# Patient Record
Sex: Female | Born: 1960 | Race: White | Hispanic: No | Marital: Single | Smoking: Never smoker
Health system: Southern US, Community
[De-identification: ages and names within clinical notes are randomized; demographics above are authoritative.]

## PROBLEM LIST (undated history)

## (undated) DIAGNOSIS — E079 Disorder of thyroid, unspecified: Secondary | ICD-10-CM

## (undated) DIAGNOSIS — E785 Hyperlipidemia, unspecified: Secondary | ICD-10-CM

## (undated) DIAGNOSIS — F329 Major depressive disorder, single episode, unspecified: Secondary | ICD-10-CM

## (undated) DIAGNOSIS — M1990 Unspecified osteoarthritis, unspecified site: Secondary | ICD-10-CM

## (undated) DIAGNOSIS — F32A Depression, unspecified: Secondary | ICD-10-CM

## (undated) DIAGNOSIS — F419 Anxiety disorder, unspecified: Secondary | ICD-10-CM

## (undated) HISTORY — PX: DILATION AND CURETTAGE OF UTERUS: SHX78

## (undated) HISTORY — PX: BRONCHOSCOPY: SUR163

## (undated) HISTORY — DX: Disorder of thyroid, unspecified: E07.9

## (undated) HISTORY — DX: Depression, unspecified: F32.A

## (undated) HISTORY — PX: LAPAROSCOPIC SALPINGOOPHERECTOMY: SUR795

## (undated) HISTORY — PX: MYOMECTOMY: SHX85

## (undated) HISTORY — DX: Anxiety disorder, unspecified: F41.9

## (undated) HISTORY — DX: Unspecified osteoarthritis, unspecified site: M19.90

## (undated) HISTORY — DX: Major depressive disorder, single episode, unspecified: F32.9

## (undated) HISTORY — DX: Hyperlipidemia, unspecified: E78.5

## (undated) HISTORY — PX: TONSILLECTOMY: SUR1361

## (undated) HISTORY — PX: COLONOSCOPY: SHX174

---

## 1999-08-17 ENCOUNTER — Other Ambulatory Visit: Admission: RE | Admit: 1999-08-17 | Discharge: 1999-08-17 | Payer: Self-pay | Admitting: Obstetrics and Gynecology

## 2000-08-29 ENCOUNTER — Other Ambulatory Visit: Admission: RE | Admit: 2000-08-29 | Discharge: 2000-08-29 | Payer: Self-pay | Admitting: Obstetrics and Gynecology

## 2000-11-25 ENCOUNTER — Other Ambulatory Visit: Admission: RE | Admit: 2000-11-25 | Discharge: 2000-11-25 | Payer: Self-pay | Admitting: Obstetrics and Gynecology

## 2001-02-21 ENCOUNTER — Other Ambulatory Visit: Admission: RE | Admit: 2001-02-21 | Discharge: 2001-02-21 | Payer: Self-pay | Admitting: Obstetrics and Gynecology

## 2001-06-01 ENCOUNTER — Other Ambulatory Visit: Admission: RE | Admit: 2001-06-01 | Discharge: 2001-06-01 | Payer: Self-pay | Admitting: Obstetrics and Gynecology

## 2001-09-01 ENCOUNTER — Other Ambulatory Visit: Admission: RE | Admit: 2001-09-01 | Discharge: 2001-09-01 | Payer: Self-pay | Admitting: Obstetrics and Gynecology

## 2002-09-03 ENCOUNTER — Other Ambulatory Visit: Admission: RE | Admit: 2002-09-03 | Discharge: 2002-09-03 | Payer: Self-pay | Admitting: Obstetrics and Gynecology

## 2002-10-08 ENCOUNTER — Other Ambulatory Visit (HOSPITAL_COMMUNITY): Admission: RE | Admit: 2002-10-08 | Discharge: 2002-10-19 | Payer: Self-pay | Admitting: *Deleted

## 2003-09-05 ENCOUNTER — Other Ambulatory Visit: Admission: RE | Admit: 2003-09-05 | Discharge: 2003-09-05 | Payer: Self-pay | Admitting: Obstetrics and Gynecology

## 2004-09-02 ENCOUNTER — Ambulatory Visit: Payer: Self-pay | Admitting: Internal Medicine

## 2004-09-07 ENCOUNTER — Ambulatory Visit: Payer: Self-pay | Admitting: Internal Medicine

## 2004-09-15 ENCOUNTER — Ambulatory Visit: Payer: Self-pay

## 2004-12-15 ENCOUNTER — Other Ambulatory Visit: Admission: RE | Admit: 2004-12-15 | Discharge: 2004-12-15 | Payer: Self-pay | Admitting: Obstetrics and Gynecology

## 2005-02-01 ENCOUNTER — Ambulatory Visit: Payer: Self-pay | Admitting: Gastroenterology

## 2005-02-15 ENCOUNTER — Ambulatory Visit: Payer: Self-pay | Admitting: Gastroenterology

## 2006-02-09 ENCOUNTER — Ambulatory Visit: Payer: Self-pay | Admitting: Internal Medicine

## 2006-03-31 ENCOUNTER — Ambulatory Visit: Payer: Self-pay | Admitting: Internal Medicine

## 2006-04-12 ENCOUNTER — Ambulatory Visit: Payer: Self-pay | Admitting: Internal Medicine

## 2006-07-04 ENCOUNTER — Ambulatory Visit: Payer: Self-pay | Admitting: Internal Medicine

## 2006-07-13 ENCOUNTER — Ambulatory Visit: Payer: Self-pay | Admitting: Internal Medicine

## 2006-12-27 ENCOUNTER — Ambulatory Visit: Payer: Self-pay | Admitting: Internal Medicine

## 2006-12-27 LAB — CONVERTED CEMR LAB: Free T4: 0.7 ng/dL (ref 0.6–1.6)

## 2007-11-03 ENCOUNTER — Encounter: Admission: RE | Admit: 2007-11-03 | Discharge: 2007-11-03 | Payer: Self-pay | Admitting: Obstetrics and Gynecology

## 2008-02-01 ENCOUNTER — Telehealth: Payer: Self-pay | Admitting: Internal Medicine

## 2008-02-05 ENCOUNTER — Ambulatory Visit: Payer: Self-pay | Admitting: Internal Medicine

## 2008-02-05 LAB — CONVERTED CEMR LAB
BUN: 12 mg/dL (ref 6–23)
CO2: 29 meq/L (ref 19–32)
Chloride: 107 meq/L (ref 96–112)
GFR calc non Af Amer: 72 mL/min
Potassium: 4.1 meq/L (ref 3.5–5.1)

## 2008-02-08 ENCOUNTER — Ambulatory Visit: Payer: Self-pay | Admitting: Internal Medicine

## 2008-02-08 DIAGNOSIS — E039 Hypothyroidism, unspecified: Secondary | ICD-10-CM | POA: Insufficient documentation

## 2008-02-08 DIAGNOSIS — F32A Depression, unspecified: Secondary | ICD-10-CM | POA: Insufficient documentation

## 2008-02-08 DIAGNOSIS — F329 Major depressive disorder, single episode, unspecified: Secondary | ICD-10-CM

## 2008-05-16 ENCOUNTER — Encounter: Admission: RE | Admit: 2008-05-16 | Discharge: 2008-05-16 | Payer: Self-pay | Admitting: Obstetrics and Gynecology

## 2008-12-19 ENCOUNTER — Encounter: Admission: RE | Admit: 2008-12-19 | Discharge: 2008-12-19 | Payer: Self-pay | Admitting: Obstetrics and Gynecology

## 2009-01-10 ENCOUNTER — Telehealth (INDEPENDENT_AMBULATORY_CARE_PROVIDER_SITE_OTHER): Payer: Self-pay | Admitting: *Deleted

## 2009-01-22 ENCOUNTER — Ambulatory Visit: Payer: Self-pay | Admitting: Internal Medicine

## 2009-01-22 LAB — CONVERTED CEMR LAB
Basophils Relative: 0.2 % (ref 0.0–3.0)
CO2: 29 meq/L (ref 19–32)
Calcium: 8.7 mg/dL (ref 8.4–10.5)
Direct LDL: 143.5 mg/dL
GFR calc non Af Amer: 71.06 mL/min (ref >60)
HDL: 54.2 mg/dL (ref >39.00)
Hemoglobin, Urine: NEGATIVE
Hemoglobin: 13.3 g/dL (ref 12.0–15.0)
Lymphocytes Relative: 29.6 % (ref 12.0–46.0)
MCHC: 35 g/dL (ref 30.0–36.0)
Monocytes Relative: 7.8 % (ref 3.0–12.0)
Neutro Abs: 4.2 10*3/uL (ref 1.4–7.7)
Nitrite: NEGATIVE
RBC: 4.17 M/uL (ref 3.87–5.11)
Sodium: 142 meq/L (ref 135–145)
TSH: 1.29 microintl units/mL (ref 0.35–5.50)
Total CHOL/HDL Ratio: 4
Urobilinogen, UA: 0.2 (ref 0.0–1.0)
VLDL: 10 mg/dL (ref 0.0–40.0)

## 2009-01-30 ENCOUNTER — Ambulatory Visit: Payer: Self-pay | Admitting: Internal Medicine

## 2009-01-30 DIAGNOSIS — E785 Hyperlipidemia, unspecified: Secondary | ICD-10-CM | POA: Insufficient documentation

## 2010-01-02 ENCOUNTER — Encounter (INDEPENDENT_AMBULATORY_CARE_PROVIDER_SITE_OTHER): Payer: Self-pay | Admitting: *Deleted

## 2010-01-14 ENCOUNTER — Ambulatory Visit: Payer: Self-pay | Admitting: Internal Medicine

## 2010-01-14 LAB — CONVERTED CEMR LAB
AST: 16 units/L (ref 0–37)
Albumin: 3.8 g/dL (ref 3.5–5.2)
Alkaline Phosphatase: 48 units/L (ref 39–117)
BUN: 13 mg/dL (ref 6–23)
Basophils Absolute: 0 10*3/uL (ref 0.0–0.1)
Bilirubin, Direct: 0.1 mg/dL (ref 0.0–0.3)
Chloride: 107 meq/L (ref 96–112)
Cholesterol: 201 mg/dL — ABNORMAL HIGH (ref 0–200)
Creatinine, Ser: 0.9 mg/dL (ref 0.4–1.2)
Eosinophils Absolute: 0.1 10*3/uL (ref 0.0–0.7)
GFR calc non Af Amer: 70.77 mL/min (ref >60)
HCT: 38.9 % (ref 36.0–46.0)
Lymphs Abs: 2.2 10*3/uL (ref 0.7–4.0)
MCHC: 34.1 g/dL (ref 30.0–36.0)
Monocytes Absolute: 0.7 10*3/uL (ref 0.1–1.0)
Monocytes Relative: 8.4 % (ref 3.0–12.0)
Platelets: 308 10*3/uL (ref 150.0–400.0)
RDW: 13 % (ref 11.5–14.6)
TSH: 1.91 microintl units/mL (ref 0.35–5.50)
Total CHOL/HDL Ratio: 4

## 2010-01-20 ENCOUNTER — Ambulatory Visit: Payer: Self-pay | Admitting: Internal Medicine

## 2010-01-20 DIAGNOSIS — R5381 Other malaise: Secondary | ICD-10-CM | POA: Insufficient documentation

## 2010-01-20 DIAGNOSIS — R5383 Other fatigue: Secondary | ICD-10-CM

## 2010-01-20 DIAGNOSIS — L509 Urticaria, unspecified: Secondary | ICD-10-CM | POA: Insufficient documentation

## 2010-03-02 ENCOUNTER — Encounter (INDEPENDENT_AMBULATORY_CARE_PROVIDER_SITE_OTHER): Payer: Self-pay | Admitting: *Deleted

## 2010-03-04 ENCOUNTER — Ambulatory Visit: Payer: Self-pay | Admitting: Gastroenterology

## 2010-03-18 ENCOUNTER — Ambulatory Visit: Payer: Self-pay | Admitting: Gastroenterology

## 2010-03-20 IMAGING — MG MM DIGITAL DIAGNOSTIC UNILAT*L*
4 series · 4 of 4 positions shown · non-contrast
Comparison: Previous examinations at the [REDACTED] of
most recent are dated 11/03/2007 and 10/18/2007.

CLINICAL DATA: Follow-up probably benign left breast
calcifications.

DIGITAL DIAGNOSTIC LEFT MAMMOGRAM WITH CAD

[L CC (1 of 2)]
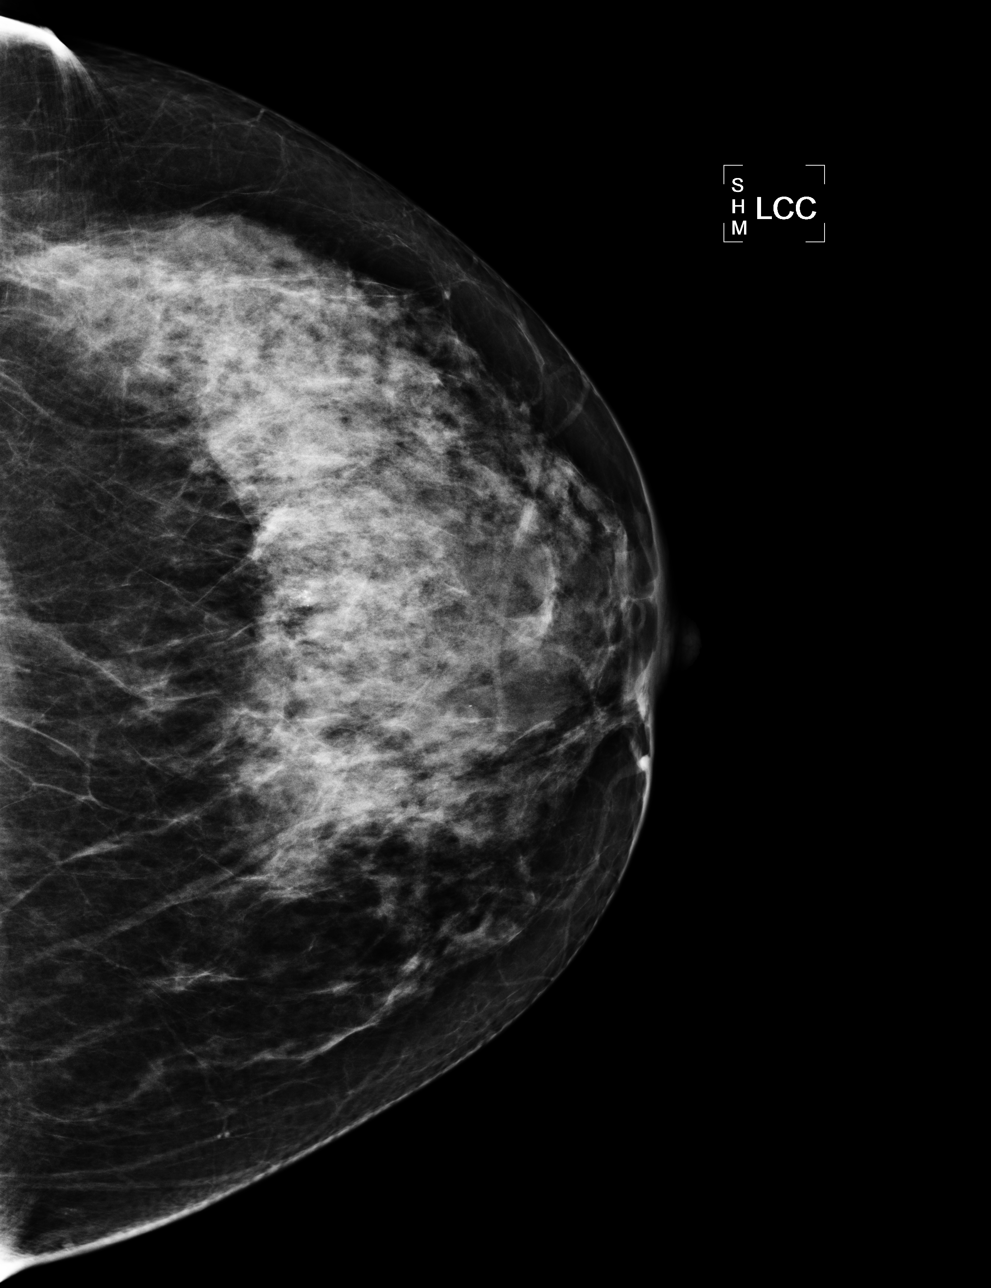

[L MLO]
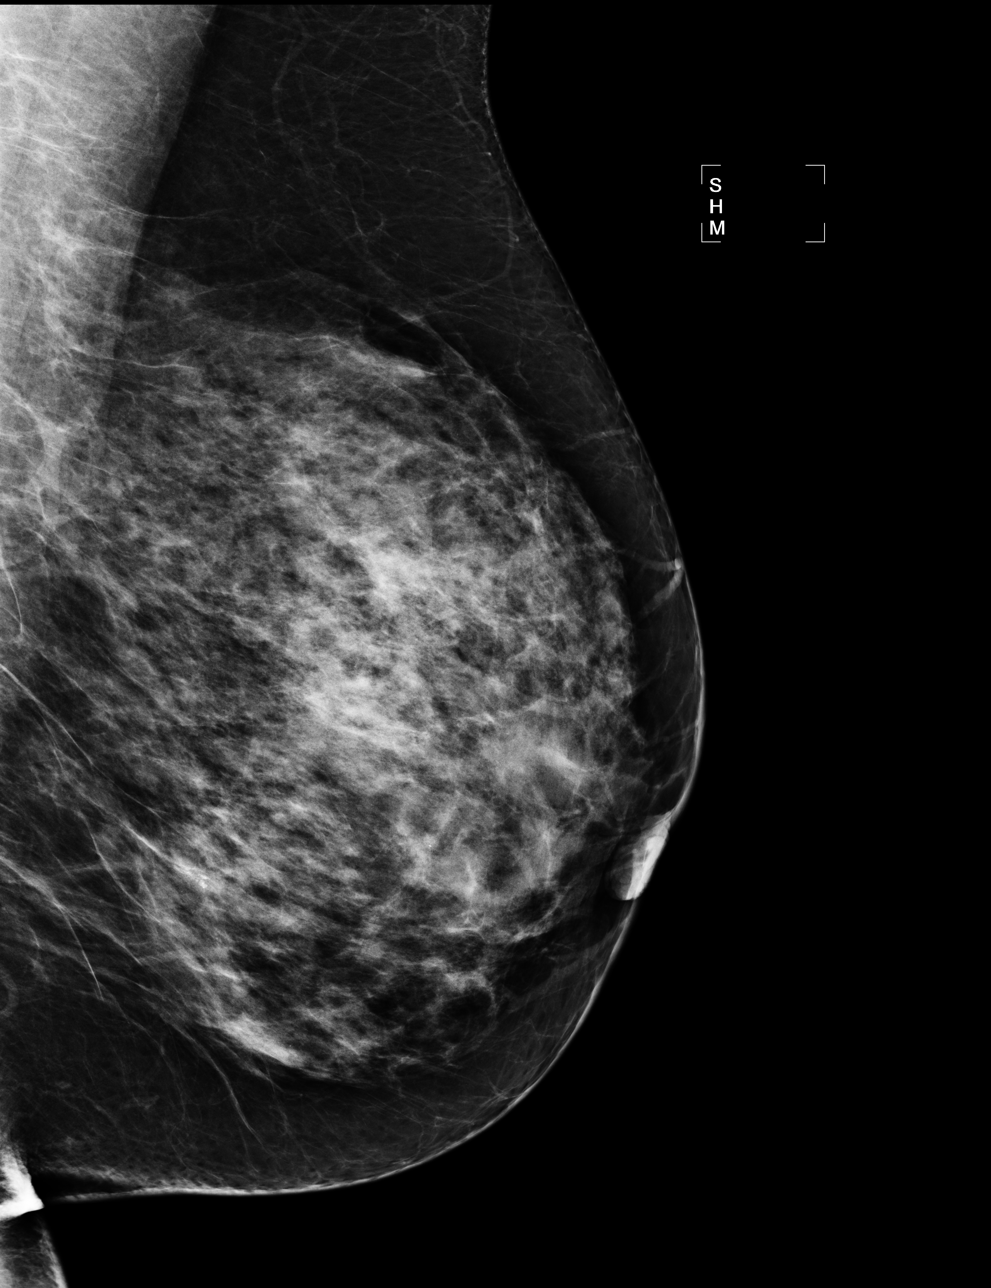

[L CC (2 of 2)]
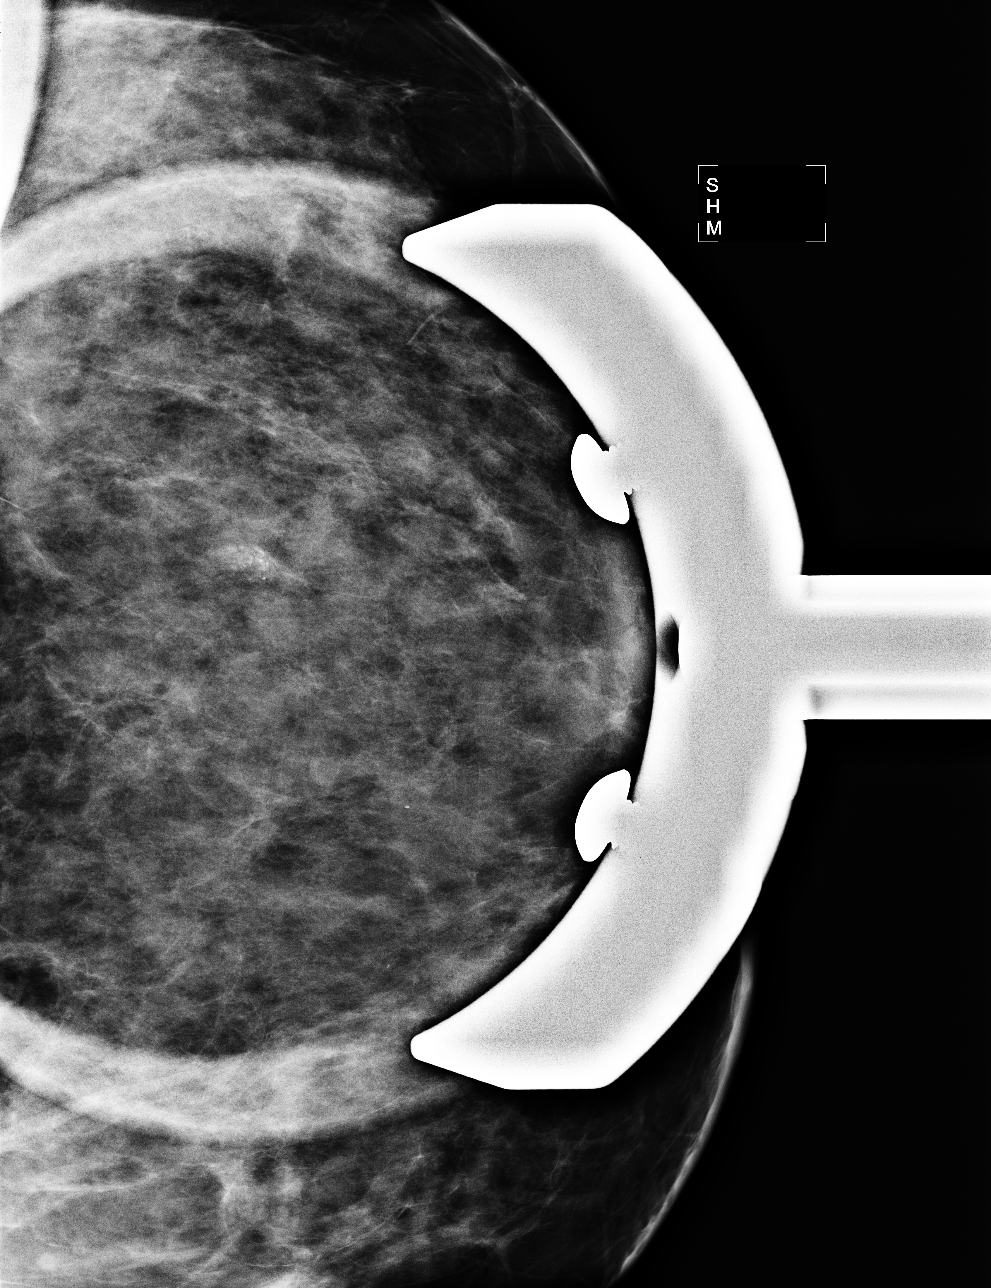

[L ML]
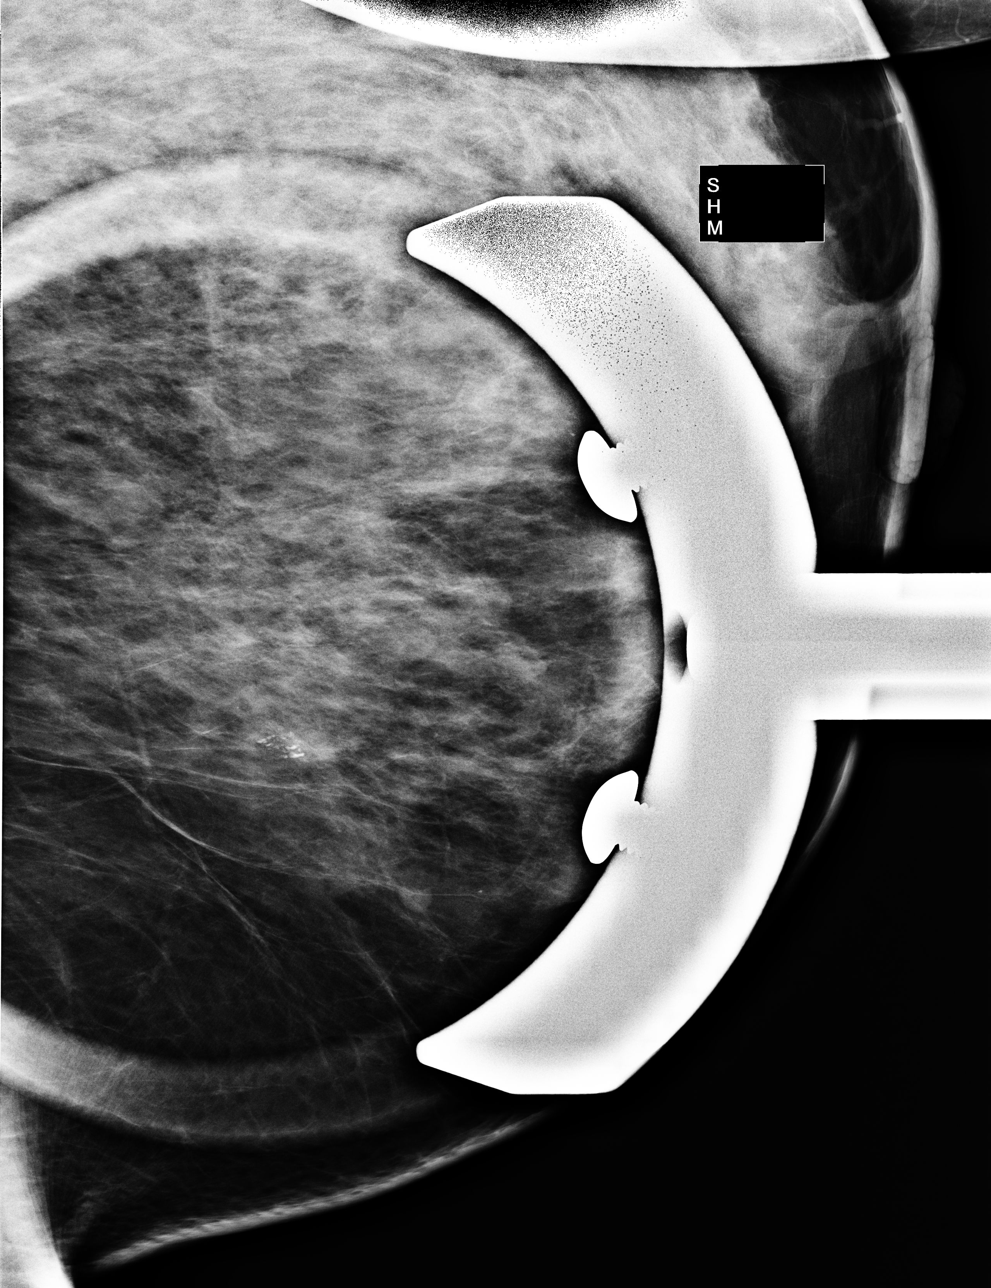

[4 of 4 positions shown; findings below may reference images not displayed]

FINDINGS: Stable heterogeneously dense parenchyma in the left
breast.  The previously evaluated probably benign left breast
calcifications demonstrate interval coalescence with dependent
layering, compatible with milk of calcium.  There are no findings
suspicious for malignancy.
IMPRESSION: Benign left breast calcifications.  No evidence of malignancy.
Annual screening mammography is recommended.  The patient will be
due for next screening mammogram in 5 months.  This has been
discussed with the patient.

BI-RADS CATEGORY 2:  Benign finding(s).

## 2010-04-24 ENCOUNTER — Ambulatory Visit: Payer: Self-pay | Admitting: Internal Medicine

## 2010-04-24 DIAGNOSIS — E559 Vitamin D deficiency, unspecified: Secondary | ICD-10-CM | POA: Insufficient documentation

## 2010-10-12 ENCOUNTER — Other Ambulatory Visit: Payer: Self-pay | Admitting: Internal Medicine

## 2010-10-12 ENCOUNTER — Ambulatory Visit
Admission: RE | Admit: 2010-10-12 | Discharge: 2010-10-12 | Payer: Self-pay | Source: Home / Self Care | Attending: Internal Medicine | Admitting: Internal Medicine

## 2010-10-12 ENCOUNTER — Encounter: Payer: Self-pay | Admitting: Internal Medicine

## 2010-10-12 LAB — CBC WITH DIFFERENTIAL/PLATELET
Basophils Absolute: 0 10*3/uL (ref 0.0–0.1)
Basophils Relative: 0.3 % (ref 0.0–3.0)
Eosinophils Absolute: 0.1 10*3/uL (ref 0.0–0.7)
Eosinophils Relative: 1.9 % (ref 0.0–5.0)
HCT: 41.7 % (ref 36.0–46.0)
Hemoglobin: 14.1 g/dL (ref 12.0–15.0)
Lymphocytes Relative: 35.9 % (ref 12.0–46.0)
Lymphs Abs: 2.2 10*3/uL (ref 0.7–4.0)
MCHC: 33.7 g/dL (ref 30.0–36.0)
MCV: 92.3 fl (ref 78.0–100.0)
Monocytes Absolute: 0.5 10*3/uL (ref 0.1–1.0)
Monocytes Relative: 8.2 % (ref 3.0–12.0)
Neutro Abs: 3.3 10*3/uL (ref 1.4–7.7)
Neutrophils Relative %: 53.7 % (ref 43.0–77.0)
Platelets: 328 10*3/uL (ref 150.0–400.0)
RBC: 4.52 Mil/uL (ref 3.87–5.11)
RDW: 14 % (ref 11.5–14.6)
WBC: 6.2 10*3/uL (ref 4.5–10.5)

## 2010-10-12 LAB — URINALYSIS, ROUTINE W REFLEX MICROSCOPIC
Bilirubin Urine: NEGATIVE
Ketones, ur: NEGATIVE
Leukocytes, UA: NEGATIVE
Nitrite: NEGATIVE
Specific Gravity, Urine: 1.015 (ref 1.000–1.030)
Urine Glucose: NEGATIVE
Urobilinogen, UA: 1 (ref 0.0–1.0)
pH: 8 (ref 5.0–8.0)

## 2010-10-12 LAB — LIPID PANEL
Cholesterol: 215 mg/dL — ABNORMAL HIGH (ref 0–200)
HDL: 52 mg/dL (ref >39.00)
Total CHOL/HDL Ratio: 4
Triglycerides: 67 mg/dL (ref 0.0–149.0)
VLDL: 13.4 mg/dL (ref 0.0–40.0)

## 2010-10-12 LAB — HEPATIC FUNCTION PANEL
ALT: 12 U/L (ref 0–35)
AST: 18 U/L (ref 0–37)
Albumin: 4.1 g/dL (ref 3.5–5.2)
Alkaline Phosphatase: 53 U/L (ref 39–117)
Bilirubin, Direct: 0.1 mg/dL (ref 0.0–0.3)
Total Bilirubin: 0.6 mg/dL (ref 0.3–1.2)
Total Protein: 6.7 g/dL (ref 6.0–8.3)

## 2010-10-12 LAB — LDL CHOLESTEROL, DIRECT: Direct LDL: 152 mg/dL

## 2010-10-12 LAB — TSH: TSH: 0.86 u[IU]/mL (ref 0.35–5.50)

## 2010-10-12 LAB — BASIC METABOLIC PANEL
BUN: 14 mg/dL (ref 6–23)
CO2: 28 mEq/L (ref 19–32)
Calcium: 8.9 mg/dL (ref 8.4–10.5)
Chloride: 101 mEq/L (ref 96–112)
Creatinine, Ser: 1 mg/dL (ref 0.4–1.2)
GFR: 66.29 mL/min (ref >60.00)
Glucose, Bld: 82 mg/dL (ref 70–99)
Potassium: 4.5 mEq/L (ref 3.5–5.1)
Sodium: 136 mEq/L (ref 135–145)

## 2010-10-28 ENCOUNTER — Encounter (INDEPENDENT_AMBULATORY_CARE_PROVIDER_SITE_OTHER): Payer: 59 | Admitting: Internal Medicine

## 2010-10-28 ENCOUNTER — Encounter: Payer: Self-pay | Admitting: Internal Medicine

## 2010-10-28 ENCOUNTER — Ambulatory Visit: Admit: 2010-10-28 | Payer: Self-pay | Admitting: Internal Medicine

## 2010-10-28 DIAGNOSIS — Z0000 Encounter for general adult medical examination without abnormal findings: Secondary | ICD-10-CM

## 2010-10-29 NOTE — Assessment & Plan Note (Signed)
Summary: 3 MO ROV /NWS   Vital Signs:  Patient profile:   50 year old female Height:      64 inches Weight:      210 pounds BMI:     36.18 O2 Sat:      99 % on Room air Temp:     98.7 degrees F oral Pulse rate:   95 / minute Pulse rhythm:   regular Resp:     16 per minute BP sitting:   120 / 80  (left arm) Cuff size:   large  Vitals Entered By: Lanier Prude, CMA(AAMA) (April 24, 2010 1:20 PM)  O2 Flow:  Room air CC: 3 mo f/u Is Patient Diabetic? No   CC:  3 mo f/u.  History of Present Illness: F/u hives, hypothyroidism, obesity  Current Medications (verified): 1)  Cymbalta 30 Mg  Cpep (Duloxetine Hcl) .... 3 Tablets By Mouth Once Daily 2)  Levoxyl 75 Mcg  Tabs (Levothyroxine Sodium) .... Take 1 Tablet By Mouth Once A Day 3)  Klonopin 1 Mg Tabs (Clonazepam) .... Once Daily 4)  Vitamin D3 1000 Unit  Tabs (Cholecalciferol) .Marland Kitchen.. 1 By Mouth Daily 5)  Calcium + D 600-200 Mg-Unit Tabs (Calcium Carbonate-Vitamin D) .Marland Kitchen.. 1 Tab Once Daily 6)  Loratadine 10 Mg Tabs (Loratadine) .Marland Kitchen.. 1 By Mouth Once Daily As Needed Allergies  Allergies (verified): No Known Drug Allergies  Past History:  Past Medical History: Last updated: 01/30/2009 Depression Hypothyroidism Hyperlipidemia, mild GYN Dr Henderson Cloud  Family History: Last updated: 01/20/2010 B lung ca 43  Social History: Last updated: 02/08/2008 Occupation: Single Never Smoked  Review of Systems       The patient complains of weight gain.  The patient denies chest pain and dyspnea on exertion.    Physical Exam  General:  overweight-appearing.   Eyes:  No corneal or conjunctival inflammation noted. EOMI. Perrla.  Nose:  External nasal examination shows no deformity or inflammation. Nasal mucosa are pink and moist without lesions or exudates. Mouth:  Oral mucosa and oropharynx without lesions or exudates.  Teeth in good repair. Lungs:  Normal respiratory effort, chest expands symmetrically. Lungs are clear to  auscultation, no crackles or wheezes. Heart:  Normal rate and regular rhythm. S1 and S2 normal without gallop, murmur, click, rub or other extra sounds. Abdomen:  Bowel sounds positive,abdomen soft and non-tender without masses, organomegaly or hernias noted. Msk:  No deformity or scoliosis noted of thoracic or lumbar spine.   Neurologic:  No cranial nerve deficits noted. Station and gait are normal. Plantar reflexes are down-going bilaterally. DTRs are symmetrical throughout. Sensory, motor and coordinative functions appear intact. Skin:  Intact without suspicious lesions or rashes Psych:  Oriented X3, normally interactive, and good eye contact.     Impression & Recommendations:  Problem # 1:  URTICARIA (ICD-708.9) resolved Assessment Improved  Problem # 2:  HYPOTHYROIDISM (ICD-244.9) Assessment: Unchanged  Her updated medication list for this problem includes:    Levoxyl 75 Mcg Tabs (Levothyroxine sodium) .Marland Kitchen... Take 1 tablet by mouth once a day  Problem # 3:  FATIGUE (ICD-780.79) Assessment: Unchanged  Problem # 4:  DEPRESSION (ICD-311) Assessment: Unchanged  Her updated medication list for this problem includes:    Cymbalta 30 Mg Cpep (Duloxetine hcl) .Marland KitchenMarland KitchenMarland KitchenMarland Kitchen 3 tablets by mouth once daily    Klonopin 1 Mg Tabs (Clonazepam) ..... Once daily  Problem # 5:  VITAMIN D DEFICIENCY (ICD-268.9) Assessment: Comment Only Restart Vit D  Complete Medication List: 1)  Cymbalta  30 Mg Cpep (Duloxetine hcl) .... 3 tablets by mouth once daily 2)  Levoxyl 75 Mcg Tabs (Levothyroxine sodium) .... Take 1 tablet by mouth once a day 3)  Klonopin 1 Mg Tabs (Clonazepam) .... Once daily 4)  Vitamin D3 1000 Unit Tabs (Cholecalciferol) .Marland Kitchen.. 1 by mouth daily 5)  Loratadine 10 Mg Tabs (Loratadine) .Marland Kitchen.. 1 by mouth once daily as needed allergies 6)  Fluorometholone 0.1 % Susp (Fluorometholone) .... As directed x 14 days 7)  Besivance 0.6 % Susp (Besifloxacin hcl) .... As directed x 7 days  Patient  Instructions: 1)  Please schedule a follow-up appointment in 6 months well w/labs and vit D 268.9. Prescriptions: LORATADINE 10 MG TABS (LORATADINE) 1 by mouth once daily as needed allergies  #30 x 11   Entered and Authorized by:   Tresa Garter MD   Signed by:   Tresa Garter MD on 04/24/2010   Method used:   [facility] to        [facility] [phone][facility] (retail)       [address]       [place], [place]  [address]       Ph: [phone] or [phone]       Fax: [phone]   RxID:   [id number]

## 2010-10-29 NOTE — Procedures (Signed)
Summary: Colonoscopy  Patient: [name] Note: All result statuses are Final unless otherwise noted.  Tests: (1) Colonoscopy (COL)   COL Colonoscopy           DONE     [facility]     [address]     [place], [place]  [address]           COLONOSCOPY PROCEDURE REPORT           PATIENT:  Barbara, Costa  MR#:  [id number]     BIRTHDATE:  08/02/1961, 49 yrs. old  GENDER:  female     ENDOSCOPIST:  Vania Rea. Jarold Motto, MD, [facility]     REF. BY:     PROCEDURE DATE:  03/18/2010     PROCEDURE:  Surveillance Colonoscopy     ASA CLASS:  Class II     INDICATIONS:  Elevated Risk Screening += FH colon CA in several     members     MEDICATIONS:   Fentanyl 100 mcg IV, Versed 10 mg IV           DESCRIPTION OF PROCEDURE:   After the risks benefits and     alternatives of the procedure were thoroughly explained, informed     consent was obtained.  Digital rectal exam was performed and     revealed no abnormalities.   The LB [id number] endoscope     was introduced through the anus and advanced to the cecum, which     was identified by both the appendix and ileocecal valve, limited     by poor preparation.    The quality of the prep was poor, using     MoviPrep.  The instrument was then slowly withdrawn as the colon     was fully examined.     <<PROCEDUREIMAGES>>           FINDINGS:  No polyps or cancers were seen.  Poor prep limited this     examination.   Retroflexed views in the rectum revealed no     abnormalities.    The scope was then withdrawn from the patient     and the procedure completed.           COMPLICATIONS:  None     ENDOSCOPIC IMPRESSION:     1) No polyps or cancers     2) Poor prep (exam limited)     limited exam per poor prep     RECOMMENDATIONS:     REPEAT EXAM WIYH "DOUBLE PREP" IN 3 YEARS.     REPEAT EXAM:  No           ______________________________     Vania Rea. Jarold Motto, MD, [facility]           CC:  Linda Hedges. Plotnikov, MD           n.  Rosalie DoctorVania Rea. Gianella Chismar at 03/18/2010 10:48 AM           Merlene Laughter, [id number]  Note: An exclamation mark (!) indicates a result that was not dispersed into the flowsheet. Document Creation Date: 03/18/2010 10:48 AM _______________________________________________________________________  (1) Order result status: Final Collection or observation date-time: 03/18/2010 10:41 Requested date-time:  Receipt date-time:  Reported date-time:  Referring Physician:   Ordering Physician: Barbara Costa [phone]) Specimen Source:  Source: Launa Grill Order Number: [id number] Lab site:   Appended Document: Colonoscopy     Procedures Next Due Date:    Colonoscopy: 03/2013

## 2010-10-29 NOTE — Letter (Signed)
Summary: Moviprep Instructions  [facility]  [address]   [place], [place] [address]   Phone: [phone]  Fax: [phone]       Barbara Costa    1961/06/16    MRN: [id number]        Procedure Day Dorna Bloom: Wednesday, 03-18-10     Arrival Time: 9:00 a.m.      Procedure Time: 10:00 a.m.     Location of Procedure:                    x   [facility] (4th Floor)                        PREPARATION FOR COLONOSCOPY WITH MOVIPREP   Starting 5 days prior to your procedure 03-13-10 do not eat nuts, seeds, popcorn, corn, beans, peas,  salads, or any raw vegetables.  Do not take any fiber supplements (e.g. Metamucil, Citrucel, and Benefiber).  THE DAY BEFORE YOUR PROCEDURE         DATE: 03-17-10   DAY: Tuesday  1.  Drink clear liquids the entire day-NO SOLID FOOD  2.  Do not drink anything colored red or purple.  Avoid juices with pulp.  No orange juice.  3.  Drink at least 64 oz. (8 glasses) of fluid/clear liquids during the day to prevent dehydration and help the prep work efficiently.  CLEAR LIQUIDS INCLUDE: Water Jello Ice Popsicles Tea (sugar ok, no milk/cream) Powdered fruit flavored drinks Coffee (sugar ok, no milk/cream) Gatorade Juice: apple, white grape, white cranberry  Lemonade Clear bullion, consomm, broth Carbonated beverages (any kind) Strained chicken noodle soup Hard Candy                             4.  In the morning, mix first dose of MoviPrep solution:    Empty 1 Pouch A and 1 Pouch B into the disposable container    Add lukewarm drinking water to the top line of the container. Mix to dissolve    Refrigerate (mixed solution should be used within 24 hrs)  5.  Begin drinking the prep at 5:00 p.m. The MoviPrep container is divided by 4 marks.   Every 15 minutes drink the solution down to the next mark (approximately 8 oz) until the full liter is complete.   6.  Follow completed prep with 16 oz of clear liquid of your  choice (Nothing red or purple).  Continue to drink clear liquids until bedtime.  7.  Before going to bed, mix second dose of MoviPrep solution:    Empty 1 Pouch A and 1 Pouch B into the disposable container    Add lukewarm drinking water to the top line of the container. Mix to dissolve    Refrigerate  THE DAY OF YOUR PROCEDURE      DATE: 03-18-10  DAY: Wednesday  Beginning at  5:00 a.m. (5 hours before procedure):         1. Every 15 minutes, drink the solution down to the next mark (approx 8 oz) until the full liter is complete.  2. Follow completed prep with 16 oz. of clear liquid of your choice.    3. You may drink clear liquids until 8:00 a.m.  (2 HOURS BEFORE PROCEDURE).   MEDICATION INSTRUCTIONS  Unless otherwise instructed, you should take regular prescription medications with a small sip of water   as early  as possible the morning of your procedure.           OTHER INSTRUCTIONS  You will need a responsible adult at least 50 years of age to accompany you and drive you home.   This person must remain in the waiting room during your procedure.  Wear loose fitting clothing that is easily removed.  Leave jewelry and other valuables at home.  However, you may wish to bring a book to read or  an iPod/MP3 player to listen to music as you wait for your procedure to start.  Remove all body piercing jewelry and leave at home.  Total time from sign-in until discharge is approximately 2-3 hours.  You should go home directly after your procedure and rest.  You can resume normal activities the  day after your procedure.  The day of your procedure you should not:   Drive   Make legal decisions   Operate machinery   Drink alcohol   Return to work  You will receive specific instructions about eating, activities and medications before you leave.    The above instructions have been reviewed and explained to me by   Ezra Sites RN  March 04, 2010 10:48 AM     I  fully understand and can verbalize these instructions _____________________________ Date _________

## 2010-10-29 NOTE — Miscellaneous (Signed)
Summary: [facility]  Clinical Lists Changes  Medications: Added new medication of MOVIPREP 100 GM  SOLR (PEG-KCL-NACL-NASULF-NA ASC-C) As per prep instructions. - Signed Rx of MOVIPREP 100 GM  SOLR (PEG-KCL-NACL-NASULF-NA ASC-C) As per prep instructions.;  #1 x 0;  Signed;  Entered by: Ezra Sites RN;  Authorized by: Mardella Layman MD [facility];  Method used: [facility] [phone]*, [address], [place], [place]  [address], Ph: [phone] or [phone], Fax: [phone] Observations: Added new observation of NKA: T (03/04/2010 10:26)    Prescriptions: MOVIPREP 100 GM  SOLR (PEG-KCL-NACL-NASULF-NA ASC-C) As per prep instructions.  #1 x 0   Entered by:   Ezra Sites RN   Authorized by:   Mardella Layman MD [facility]   Signed by:   Ezra Sites RN on 03/04/2010   Method used:   [facility]        [facility] [phone]* (retail)       [address]       [place], [place]  [address]       Ph: [phone] or [phone]       Fax: [phone]   RxID:   [id number]

## 2010-10-29 NOTE — Assessment & Plan Note (Signed)
Summary: 1 yr fu  $50---stc   Vital Signs:  Patient profile:   50 year old female Height:      64 inches (162.56 cm) Weight:      206.50 pounds (93.86 kg) BMI:     35.57 O2 Sat:      97 % on Room air Temp:     97.9 degrees F (36.61 degrees C) oral Pulse rate:   83 / minute Pulse rhythm:   regular BP sitting:   114 / 84  (left arm) Cuff size:   large  Vitals Entered By: Brenton Grills (January 20, 2010 9:46 AM)  O2 Flow:  Room air CC: 1 year f/u/pt states she needs 68 rx for Klonopin/aj   CC:  1 year f/u/pt states she needs 90 rx for Klonopin/aj.  History of Present Illness: C/o hives x 2 months q 2 wks C/o fatigue F/u  depression, hypothyroidism  Current Medications (verified): 1)  Cymbalta 30 Mg  Cpep (Duloxetine Hcl) .... 3 Tablets By Mouth Once Daily 2)  Levoxyl 75 Mcg  Tabs (Levothyroxine Sodium) .... Take 1 Tablet By Mouth Once A Day 3)  Klonopin 1 Mg Tabs (Clonazepam) .... Once Daily 4)  Vitamin D3 1000 Unit  Tabs (Cholecalciferol) .Marland Kitchen.. 1 By Mouth Daily 5)  Calcium + D 600-200 Mg-Unit Tabs (Calcium Carbonate-Vitamin D) .Marland Kitchen.. 1 Tab Once Daily  Allergies (verified): No Known Drug Allergies  Past History:  Past Medical History: Last updated: 01/30/2009 Depression Hypothyroidism Hyperlipidemia, mild GYN Dr Henderson Cloud  Social History: Last updated: 02/08/2008 Occupation: Single Never Smoked  Family History: B lung ca 43  Review of Systems  The patient denies fever, weight loss, weight gain, dyspnea on exertion, and prolonged cough.    Physical Exam  General:  overweight-appearing.   Mouth:  Oral mucosa and oropharynx without lesions or exudates.  Teeth in good repair. Lungs:  Normal respiratory effort, chest expands symmetrically. Lungs are clear to auscultation, no crackles or wheezes. Heart:  Normal rate and regular rhythm. S1 and S2 normal without gallop, murmur, click, rub or other extra sounds. Abdomen:  Bowel sounds positive,abdomen soft and  non-tender without masses, organomegaly or hernias noted. Msk:  No deformity or scoliosis noted of thoracic or lumbar spine.   Extremities:  No clubbing, cyanosis, edema, or deformity noted with normal full range of motion of all joints.   Neurologic:  No cranial nerve deficits noted. Station and gait are normal. Plantar reflexes are down-going bilaterally. DTRs are symmetrical throughout. Sensory, motor and coordinative functions appear intact. Psych:  Oriented X3, normally interactive, and good eye contact.     Impression & Recommendations:  Problem # 1:  FATIGUE (ICD-780.79) Assessment New Nuvigil offered The labs were reviewed with the patient.   Problem # 2:  URTICARIA (ICD-708.9) Assessment: New Loratidine 10 mg qd . See "Patient Instructions".   Problem # 3:  HYPOTHYROIDISM (ICD-244.9) Assessment: Unchanged  Her updated medication list for this problem includes:    Levoxyl 75 Mcg Tabs (Levothyroxine sodium) .Marland Kitchen... Take 1 tablet by mouth once a day  Problem # 4:  DEPRESSION (ICD-311) Assessment: Unchanged  Her updated medication list for this problem includes:    Cymbalta 30 Mg Cpep (Duloxetine hcl) .Marland KitchenMarland KitchenMarland KitchenMarland Kitchen 3 tablets by mouth once daily    Klonopin 1 Mg Tabs (Clonazepam) ..... Once daily  Complete Medication List: 1)  Cymbalta 30 Mg Cpep (Duloxetine hcl) .... 3 tablets by mouth once daily 2)  Levoxyl 75 Mcg Tabs (Levothyroxine sodium) .... Take 1  tablet by mouth once a day 3)  Klonopin 1 Mg Tabs (Clonazepam) .... Once daily 4)  Vitamin D3 1000 Unit Tabs (Cholecalciferol) .Marland Kitchen.. 1 by mouth daily 5)  Calcium + D 600-200 Mg-unit Tabs (Calcium carbonate-vitamin d) .Marland Kitchen.. 1 tab once daily 6)  Loratadine 10 Mg Tabs (Loratadine) .Marland Kitchen.. 1 by mouth once daily as needed allergies  Patient Instructions: 1)  Hold Vit D x 1-2 months  2)  Please schedule a follow-up appointment in 3 months. Prescriptions: LEVOXYL 75 MCG  TABS (LEVOTHYROXINE SODIUM) Take 1 tablet by mouth once a day  #90 x  3   Entered and Authorized by:   Tresa Garter MD   Signed by:   Tresa Garter MD on 01/20/2010   Method used:   Print then Give to Patient   RxID:   [id number] LORATADINE 10 MG TABS (LORATADINE) 1 by mouth once daily as needed allergies  #30 x 6   Entered and Authorized by:   Tresa Garter MD   Signed by:   Tresa Garter MD on 01/20/2010   Method used:   Print then Give to Patient   RxID:   [id number]

## 2010-10-29 NOTE — Letter (Signed)
Summary: Colonoscopy Letter  [facility]  [address] [place], [place] [address]   Phone: [phone]  Fax: [phone]      January 02, 2010 MRN: [id number]   Barbara Costa [address] [place], [place]  [address]   Dear Ms. Beery,   According to your medical record, it is time for you to schedule a Colonoscopy. The American Cancer Society recommends this procedure as a method to detect early colon cancer. Patients with a family history of colon cancer, or a personal history of colon polyps or inflammatory bowel disease are at increased risk.  This letter has beeen generated based on the recommendations made at the time of your procedure. If you feel that in your particular situation this may no longer apply, please contact our office.  Please call our office at [phone] to schedule this appointment or to update your records at your earliest convenience.  Thank you for cooperating with [place] to provide you with the very best care possible.   Sincerely,    Vania Rea. Jarold Motto, M.D.  [facility] [phone]

## 2010-11-04 NOTE — Assessment & Plan Note (Signed)
Summary: CPX NWS   Vital Signs:  Patient profile:   50 year old female Height:      64 inches Weight:      214 pounds BMI:     36.87 Temp:     98.7 degrees F oral Pulse rate:   80 / minute Pulse rhythm:   regular Resp:     16 per minute BP sitting:   110 / 80  (left arm) Cuff size:   large  Vitals Entered By: Lanier Prude, CMA(AAMA) (October 28, 2010 8:42 AM) CC: CPX Is Patient Diabetic? No Comments pt is not taking Loratadine, Fluorometholon or Besivance   CC:  CPX.  History of Present Illness: The patient presents for a preventive health examination   Preventive Screening-Counseling & Management  Alcohol-Tobacco     Smoking Status: never  Caffeine-Diet-Exercise     Does Patient Exercise: yes  Current Medications (verified): 1)  Cymbalta 30 Mg  Cpep (Duloxetine Hcl) .... 3 Tablets By Mouth Once Daily 2)  Levoxyl 75 Mcg  Tabs (Levothyroxine Sodium) .... Take 1 Tablet By Mouth Once A Day 3)  Klonopin 0.5 Mg Tabs (Clonazepam) .Marland Kitchen.. 1 By Mouth Once Daily 4)  Vitamin D3 1000 Unit  Tabs (Cholecalciferol) .Marland Kitchen.. 1 By Mouth Daily 5)  Loratadine 10 Mg Tabs (Loratadine) .Marland Kitchen.. 1 By Mouth Once Daily As Needed Allergies 6)  Fluorometholone 0.1 % Susp (Fluorometholone) .... As Directed X 14 Days 7)  Besivance 0.6 % Susp (Besifloxacin Hcl) .... As Directed X 7 Days 8)  Calcium 600+d Plus Minerals 600-400 Mg-Unit Chew (Calcium Carbonate-Vit D-Min) .Marland Kitchen.. 1 By Mouth Once Daily  Allergies (verified): No Known Drug Allergies  Past History:  Past Medical History: Last updated: 01/30/2009 Depression Hypothyroidism Hyperlipidemia, mild GYN Dr Henderson Cloud  Social History: Last updated: 10/28/2010 Occupation: Single Never Smoked Regular exercise-yes Alcohol use-yes occ  Family History: B lung ca 2 M CHF 35 F DM2  Social History: Occupation: Single Never Smoked Regular exercise-yes Alcohol use-yes occ Does Patient Exercise:  yes  Review of Systems       The patient  complains of weight gain.  The patient denies anorexia, fever, weight loss, vision loss, decreased hearing, hoarseness, chest pain, syncope, dyspnea on exertion, peripheral edema, prolonged cough, headaches, hemoptysis, abdominal pain, melena, hematochezia, severe indigestion/heartburn, hematuria, incontinence, genital sores, muscle weakness, suspicious skin lesions, transient blindness, difficulty walking, depression, unusual weight change, abnormal bleeding, enlarged lymph nodes, angioedema, and breast masses.    Physical Exam  General:  overweight-appearing.   Nose:  External nasal examination shows no deformity or inflammation. Nasal mucosa are pink and moist without lesions or exudates. Mouth:  Oral mucosa and oropharynx without lesions or exudates.  Teeth in good repair. Lungs:  Normal respiratory effort, chest expands symmetrically. Lungs are clear to auscultation, no crackles or wheezes. Heart:  Normal rate and regular rhythm. S1 and S2 normal without gallop, murmur, click, rub or other extra sounds. Abdomen:  Bowel sounds positive,abdomen soft and non-tender without masses, organomegaly or hernias noted. Msk:  No deformity or scoliosis noted of thoracic or lumbar spine.   Neurologic:  No cranial nerve deficits noted. Station and gait are normal. Plantar reflexes are down-going bilaterally. DTRs are symmetrical throughout. Sensory, motor and coordinative functions appear intact. Skin:  Intact without suspicious lesions or rashes Psych:  Oriented X3, normally interactive, and good eye contact.     Impression & Recommendations:  Problem # 1:  ROUTINE GENERAL MEDICAL EXAM@HEALTH  CARE FACL (ICD-V70.0) Assessment New She  had a mamo The labs were reviewed with the patient.  Health and age related issues were discussed. Available screening tests and vaccinations were discussed as well. Healthy life style including good diet and exercise was discussed.  EKG was ok  Problem # 2:   HYPOTHYROIDISM (ICD-244.9) Assessment: Unchanged  Her updated medication list for this problem includes:    Levoxyl 75 Mcg Tabs (Levothyroxine sodium) .Marland Kitchen... Take 1 tablet by mouth once a day  Problem # 3:  HYPERLIPIDEMIA (ICD-272.4) Assessment: Unchanged Diet and wt loss  Problem # 4:  DEPRESSION (ICD-311) Assessment: Unchanged  Her updated medication list for this problem includes:    Cymbalta 30 Mg Cpep (Duloxetine hcl) .Marland KitchenMarland KitchenMarland KitchenMarland Kitchen 3 tablets by mouth once daily    Klonopin 0.5 Mg Tabs (Clonazepam) .Marland Kitchen... 1 by mouth once daily  Complete Medication List: 1)  Cymbalta 30 Mg Cpep (Duloxetine hcl) .... 3 tablets by mouth once daily 2)  Levoxyl 75 Mcg Tabs (Levothyroxine sodium) .... Take 1 tablet by mouth once a day 3)  Klonopin 0.5 Mg Tabs (Clonazepam) .Marland Kitchen.. 1 by mouth once daily 4)  Vitamin D3 1000 Unit Tabs (Cholecalciferol) .Marland Kitchen.. 1 by mouth daily 5)  Loratadine 10 Mg Tabs (Loratadine) .Marland Kitchen.. 1 by mouth once daily as needed allergies 6)  Fluorometholone 0.1 % Susp (Fluorometholone) .... As directed x 14 days 7)  Besivance 0.6 % Susp (Besifloxacin hcl) .... As directed x 7 days 8)  Calcium 600+d Plus Minerals 600-400 Mg-unit Chew (Calcium carbonate-vit d-min) .Marland Kitchen.. 1 by mouth once daily  Other Orders: EKG w/ Interpretation (93000)  Patient Instructions: 1)  Please schedule a follow-up appointment in 1 year well w/labs. Prescriptions: LEVOXYL 75 MCG  TABS (LEVOTHYROXINE SODIUM) Take 1 tablet by mouth once a day  #90 x 3   Entered and Authorized by:   Tresa Garter MD   Signed by:   Tresa Garter MD on 10/28/2010   Method used:   Faxed to ...       [facility] (mail-order)             , [place]         Ph: [phone]       Fax: [phone]   RxID:   [id number]    Orders Added: 1)  EKG w/ Interpretation [93000] 2)  Est. Patient 40-64 years [62952]    Contraindications/Deferment of Procedures/Staging:    Test/Procedure: FLU VAX    Reason for deferment: patient  declined

## 2011-12-02 ENCOUNTER — Telehealth: Payer: Self-pay | Admitting: *Deleted

## 2011-12-02 DIAGNOSIS — Z0000 Encounter for general adult medical examination without abnormal findings: Secondary | ICD-10-CM

## 2011-12-02 NOTE — Telephone Encounter (Signed)
Message copied by Merrilyn Puma on Thu Dec 02, 2011  1:51 PM ------      Message from: Newell Coral      Created: Thu Dec 02, 2011 10:03 AM      Regarding: cpe sch, needs labs       The pt called and scheduled her cpe and is hoping for labs.  Thanks!!

## 2011-12-02 NOTE — Telephone Encounter (Signed)
Labs entered.

## 2011-12-04 ENCOUNTER — Other Ambulatory Visit: Payer: Self-pay | Admitting: Internal Medicine

## 2012-02-02 ENCOUNTER — Other Ambulatory Visit (INDEPENDENT_AMBULATORY_CARE_PROVIDER_SITE_OTHER): Payer: 59

## 2012-02-02 DIAGNOSIS — Z0000 Encounter for general adult medical examination without abnormal findings: Secondary | ICD-10-CM

## 2012-02-02 LAB — URINALYSIS, ROUTINE W REFLEX MICROSCOPIC
Bilirubin Urine: NEGATIVE
Urine Glucose: NEGATIVE
Urobilinogen, UA: 0.2 (ref 0.0–1.0)

## 2012-02-02 LAB — CBC WITH DIFFERENTIAL/PLATELET
Basophils Absolute: 0 10*3/uL (ref 0.0–0.1)
Eosinophils Absolute: 0.2 10*3/uL (ref 0.0–0.7)
HCT: 39 % (ref 36.0–46.0)
Lymphs Abs: 2.3 10*3/uL (ref 0.7–4.0)
MCHC: 33.5 g/dL (ref 30.0–36.0)
MCV: 92.1 fl (ref 78.0–100.0)
Monocytes Absolute: 0.7 10*3/uL (ref 0.1–1.0)
Neutro Abs: 4.4 10*3/uL (ref 1.4–7.7)
Platelets: 289 10*3/uL (ref 150.0–400.0)
RDW: 13.1 % (ref 11.5–14.6)

## 2012-02-02 LAB — BASIC METABOLIC PANEL
BUN: 13 mg/dL (ref 6–23)
CO2: 27 mEq/L (ref 19–32)
Chloride: 104 mEq/L (ref 96–112)
Creatinine, Ser: 0.8 mg/dL (ref 0.4–1.2)
Potassium: 3.9 mEq/L (ref 3.5–5.1)

## 2012-02-02 LAB — LIPID PANEL: Cholesterol: 215 mg/dL — ABNORMAL HIGH (ref 0–200)

## 2012-02-02 LAB — HEPATIC FUNCTION PANEL
ALT: 22 U/L (ref 0–35)
AST: 25 U/L (ref 0–37)
Total Protein: 6.6 g/dL (ref 6.0–8.3)

## 2012-02-02 LAB — TSH: TSH: 1.37 u[IU]/mL (ref 0.35–5.50)

## 2012-02-07 ENCOUNTER — Encounter: Payer: Self-pay | Admitting: Internal Medicine

## 2012-02-07 ENCOUNTER — Ambulatory Visit (INDEPENDENT_AMBULATORY_CARE_PROVIDER_SITE_OTHER): Payer: 59 | Admitting: Internal Medicine

## 2012-02-07 VITALS — BP 118/82 | HR 98 | Temp 97.0°F | Ht 64.0 in | Wt 217.0 lb

## 2012-02-07 DIAGNOSIS — F329 Major depressive disorder, single episode, unspecified: Secondary | ICD-10-CM

## 2012-02-07 DIAGNOSIS — E039 Hypothyroidism, unspecified: Secondary | ICD-10-CM

## 2012-02-07 DIAGNOSIS — F3289 Other specified depressive episodes: Secondary | ICD-10-CM

## 2012-02-07 DIAGNOSIS — Z23 Encounter for immunization: Secondary | ICD-10-CM

## 2012-02-07 DIAGNOSIS — Z0000 Encounter for general adult medical examination without abnormal findings: Secondary | ICD-10-CM | POA: Insufficient documentation

## 2012-02-07 DIAGNOSIS — E669 Obesity, unspecified: Secondary | ICD-10-CM | POA: Insufficient documentation

## 2012-02-07 MED ORDER — LEVOTHYROXINE SODIUM 75 MCG PO TABS: 75.0000 ug | ORAL_TABLET | Freq: Every day | ORAL | Status: DC

## 2012-02-07 NOTE — Progress Notes (Signed)
  Subjective:    Patient ID: Barbara Costa, female    DOB: [birth year], 51 y.o.   MRN: [id number]  HPI  The patient is here for a wellness exam. The patient has been doing well overall without major physical or psychological issues going on lately. The patient needs to address  Chronic hypothyroidism, depression Wt Readings from Last 3 Encounters:  02/07/12 217 lb (98.431 kg)  10/28/10 214 lb (97.07 kg)  04/24/10 210 lb (95.255 kg)   BP Readings from Last 3 Encounters:  02/07/12 118/82  10/28/10 110/80  04/24/10 120/80      Review of Systems  Constitutional: Negative for fever, chills, diaphoresis, activity change, appetite change, fatigue and unexpected weight change.  HENT: Negative for hearing loss, ear pain, congestion, sore throat, sneezing, mouth sores, neck pain, dental problem, voice change, postnasal drip and sinus pressure.   Eyes: Negative for pain and visual disturbance.  Respiratory: Negative for cough, chest tightness, wheezing and stridor.   Cardiovascular: Negative for chest pain, palpitations and leg swelling.  Gastrointestinal: Negative for nausea, vomiting, abdominal pain, blood in stool, abdominal distention and rectal pain.  Genitourinary: Negative for dysuria, hematuria, decreased urine volume, vaginal bleeding, vaginal discharge, difficulty urinating, vaginal pain and menstrual problem.  Musculoskeletal: Negative for back pain, joint swelling and gait problem.  Skin: Negative for color change, rash and wound.  Neurological: Negative for dizziness, tremors, syncope, speech difficulty and light-headedness.  Hematological: Negative for adenopathy.  Psychiatric/Behavioral: Negative for suicidal ideas, hallucinations, behavioral problems, confusion, sleep disturbance, dysphoric mood and decreased concentration. The patient is not hyperactive.        Objective:   Physical Exam  Constitutional: She appears well-developed. No distress.  HENT:  Head: Normocephalic.   Right Ear: External ear normal.  Left Ear: External ear normal.  Nose: Nose normal.  Mouth/Throat: Oropharynx is clear and moist.  Eyes: Conjunctivae are normal. Pupils are equal, round, and reactive to light. Right eye exhibits no discharge. Left eye exhibits no discharge.  Neck: Normal range of motion. Neck supple. No JVD present. No tracheal deviation present. No thyromegaly present.  Cardiovascular: Normal rate, regular rhythm and normal heart sounds.   Pulmonary/Chest: No stridor. No respiratory distress. She has no wheezes.  Abdominal: Soft. Bowel sounds are normal. She exhibits no distension and no mass. There is no tenderness. There is no rebound and no guarding.  Musculoskeletal: She exhibits no edema and no tenderness.  Lymphadenopathy:    She has no cervical adenopathy.  Neurological: She displays normal reflexes. No cranial nerve deficit. She exhibits normal muscle tone. Coordination normal.  Skin: No rash noted. No erythema.  Psychiatric: She has a normal mood and affect. Her behavior is normal. Judgment and thought content normal.    Lab Results  Component Value Date   WBC 7.6 02/02/2012   HGB 13.1 02/02/2012   HCT 39.0 02/02/2012   PLT 289.0 02/02/2012   GLUCOSE 79 02/02/2012   CHOL 215* 02/02/2012   TRIG 88.0 02/02/2012   HDL 59.90 02/02/2012   LDLDIRECT 149.4 02/02/2012   ALT 22 02/02/2012   AST 25 02/02/2012   NA 139 02/02/2012   K 3.9 02/02/2012   CL 104 02/02/2012   CREATININE 0.8 02/02/2012   BUN 13 02/02/2012   CO2 27 02/02/2012   TSH 1.37 02/02/2012         Assessment & Plan:

## 2012-02-07 NOTE — Patient Instructions (Signed)
Wt Readings from Last 3 Encounters:  02/07/12 217 lb (98.431 kg)  10/28/10 214 lb (97.07 kg)  04/24/10 210 lb (95.255 kg)

## 2012-02-07 NOTE — Assessment & Plan Note (Signed)
Continue with current prescription therapy as reflected on the Med list.  

## 2012-02-07 NOTE — Assessment & Plan Note (Addendum)
We discussed age appropriate health related issues, including available/recomended screening tests and vaccinations. We discussed a need for adhering to healthy diet and exercise. Labs/EKG were reviewed/ordered. All questions were answered. tdap Zostavax sggested

## 2012-02-07 NOTE — Assessment & Plan Note (Signed)
Chronic and refractory Wt loss discussed

## 2012-02-18 ENCOUNTER — Encounter: Payer: Self-pay | Admitting: Internal Medicine

## 2012-02-26 LAB — HM PAP SMEAR

## 2012-02-26 LAB — HM MAMMOGRAPHY

## 2012-05-21 ENCOUNTER — Other Ambulatory Visit: Payer: Self-pay | Admitting: Internal Medicine

## 2012-05-24 ENCOUNTER — Telehealth: Payer: Self-pay | Admitting: *Deleted

## 2012-05-24 MED ORDER — LEVOTHYROXINE SODIUM 75 MCG PO TABS: 75.0000 ug | ORAL_TABLET | Freq: Every day | ORAL | Status: DC

## 2012-05-24 NOTE — Telephone Encounter (Signed)
New Rx sent.  Pt informed  

## 2012-05-24 NOTE — Telephone Encounter (Signed)
Rec fax requesting to change Levoxyl 75 mcg to Levothroid if appropriate as Levoxyl is temp unavailable. Please advise.

## 2012-05-24 NOTE — Telephone Encounter (Signed)
Ok same dose Thx

## 2013-01-15 ENCOUNTER — Telehealth: Payer: Self-pay | Admitting: *Deleted

## 2013-01-15 DIAGNOSIS — Z0000 Encounter for general adult medical examination without abnormal findings: Secondary | ICD-10-CM

## 2013-01-15 NOTE — Telephone Encounter (Signed)
Message copied by Merrilyn Puma on Mon Jan 15, 2013 11:41 AM ------      Message from: Newell Coral      Created: Thu Jan 11, 2013 12:22 PM      Regarding: cpe sch       The pt scheduled a cpe for the end of May and is hoping to get labs done before hand  ------

## 2013-01-15 NOTE — Telephone Encounter (Signed)
CPE labs entered.  

## 2013-01-24 ENCOUNTER — Encounter: Payer: Self-pay | Admitting: Gastroenterology

## 2013-02-14 ENCOUNTER — Other Ambulatory Visit (INDEPENDENT_AMBULATORY_CARE_PROVIDER_SITE_OTHER): Payer: 59

## 2013-02-14 DIAGNOSIS — Z0000 Encounter for general adult medical examination without abnormal findings: Secondary | ICD-10-CM

## 2013-02-14 LAB — CBC WITH DIFFERENTIAL/PLATELET
Basophils Relative: 0.2 % (ref 0.0–3.0)
Eosinophils Relative: 2.3 % (ref 0.0–5.0)
Lymphocytes Relative: 30 % (ref 12.0–46.0)
MCV: 92.4 fl (ref 78.0–100.0)
Monocytes Relative: 7.6 % (ref 3.0–12.0)
Neutrophils Relative %: 59.9 % (ref 43.0–77.0)
Platelets: 267 10*3/uL (ref 150.0–400.0)
RBC: 4.3 Mil/uL (ref 3.87–5.11)
WBC: 7.3 10*3/uL (ref 4.5–10.5)

## 2013-02-14 LAB — LIPID PANEL
Cholesterol: 204 mg/dL — ABNORMAL HIGH (ref 0–200)
HDL: 58.5 mg/dL (ref >39.00)
Total CHOL/HDL Ratio: 3
Triglycerides: 86 mg/dL (ref 0.0–149.0)
VLDL: 17.2 mg/dL (ref 0.0–40.0)

## 2013-02-14 LAB — HEPATIC FUNCTION PANEL
ALT: 13 U/L (ref 0–35)
Alkaline Phosphatase: 44 U/L (ref 39–117)
Bilirubin, Direct: 0 mg/dL (ref 0.0–0.3)
Total Bilirubin: 0.7 mg/dL (ref 0.3–1.2)
Total Protein: 6.7 g/dL (ref 6.0–8.3)

## 2013-02-14 LAB — URINALYSIS, ROUTINE W REFLEX MICROSCOPIC
Hgb urine dipstick: NEGATIVE
Ketones, ur: NEGATIVE
Total Protein, Urine: NEGATIVE
Urine Glucose: NEGATIVE
WBC, UA: NONE SEEN (ref 0–?)

## 2013-02-14 LAB — BASIC METABOLIC PANEL
CO2: 27 mEq/L (ref 19–32)
Chloride: 102 mEq/L (ref 96–112)
Creatinine, Ser: 0.8 mg/dL (ref 0.4–1.2)
Potassium: 4.2 mEq/L (ref 3.5–5.1)
Sodium: 137 mEq/L (ref 135–145)

## 2013-02-14 LAB — TSH: TSH: 0.92 u[IU]/mL (ref 0.35–5.50)

## 2013-02-14 LAB — LDL CHOLESTEROL, DIRECT: Direct LDL: 123.6 mg/dL

## 2013-02-22 ENCOUNTER — Ambulatory Visit (INDEPENDENT_AMBULATORY_CARE_PROVIDER_SITE_OTHER): Payer: 59 | Admitting: Internal Medicine

## 2013-02-22 ENCOUNTER — Encounter: Payer: Self-pay | Admitting: Internal Medicine

## 2013-02-22 VITALS — BP 124/86 | HR 80 | Temp 97.9°F | Resp 16 | Ht 64.0 in | Wt 166.0 lb

## 2013-02-22 DIAGNOSIS — Z0000 Encounter for general adult medical examination without abnormal findings: Secondary | ICD-10-CM

## 2013-02-22 MED ORDER — LEVOTHYROXINE SODIUM 75 MCG PO TABS: 75.0000 ug | ORAL_TABLET | Freq: Every day | ORAL | Status: DC

## 2013-02-22 NOTE — Assessment & Plan Note (Signed)
We discussed age appropriate health related issues, including available/recomended screening tests and vaccinations. We discussed a need for adhering to healthy diet and exercise. Labs/EKG were reviewed/ordered. All questions were answered.   

## 2013-02-22 NOTE — Progress Notes (Signed)
Patient ID: Barbara Costa, female   DOB: [birth year], 52 y.o.   MRN: [id number]  Subjective:    Patient ID: Barbara Costa, female    DOB: 02/09/1961, 52 y.o.   MRN: [id number]  HPI  The patient is here for a wellness exam. The patient has been doing well overall without major physical or psychological issues going on lately. The patient needs to address  Chronic hypothyroidism, depression Wt Readings from Last 3 Encounters:  02/22/13 166 lb (75.297 kg)  02/07/12 217 lb (98.431 kg)  10/28/10 214 lb (97.07 kg)   BP Readings from Last 3 Encounters:  02/22/13 124/86  02/07/12 118/82  10/28/10 110/80      Review of Systems  Constitutional: Negative for fever, chills, diaphoresis, activity change, appetite change, fatigue and unexpected weight change.  HENT: Negative for hearing loss, ear pain, congestion, sore throat, sneezing, mouth sores, neck pain, dental problem, voice change, postnasal drip and sinus pressure.   Eyes: Negative for pain and visual disturbance.  Respiratory: Negative for cough, chest tightness, wheezing and stridor.   Cardiovascular: Negative for chest pain, palpitations and leg swelling.  Gastrointestinal: Negative for nausea, vomiting, abdominal pain, blood in stool, abdominal distention and rectal pain.  Genitourinary: Negative for dysuria, hematuria, decreased urine volume, vaginal bleeding, vaginal discharge, difficulty urinating, vaginal pain and menstrual problem.  Musculoskeletal: Negative for back pain, joint swelling and gait problem.  Skin: Negative for color change, rash and wound.  Neurological: Negative for dizziness, tremors, syncope, speech difficulty and light-headedness.  Hematological: Negative for adenopathy.  Psychiatric/Behavioral: Negative for suicidal ideas, hallucinations, behavioral problems, confusion, sleep disturbance, dysphoric mood and decreased concentration. The patient is not hyperactive.        Objective:   Physical Exam   Constitutional: She appears well-developed. No distress.  HENT:  Head: Normocephalic.  Right Ear: External ear normal.  Left Ear: External ear normal.  Nose: Nose normal.  Mouth/Throat: Oropharynx is clear and moist.  Eyes: Conjunctivae are normal. Pupils are equal, round, and reactive to light. Right eye exhibits no discharge. Left eye exhibits no discharge.  Neck: Normal range of motion. Neck supple. No JVD present. No tracheal deviation present. No thyromegaly present.  Cardiovascular: Normal rate, regular rhythm and normal heart sounds.   Pulmonary/Chest: No stridor. No respiratory distress. She has no wheezes.  Abdominal: Soft. Bowel sounds are normal. She exhibits no distension and no mass. There is no tenderness. There is no rebound and no guarding.  Musculoskeletal: She exhibits no edema and no tenderness.  Lymphadenopathy:    She has no cervical adenopathy.  Neurological: She displays normal reflexes. No cranial nerve deficit. She exhibits normal muscle tone. Coordination normal.  Skin: No rash noted. No erythema.  Psychiatric: She has a normal mood and affect. Her behavior is normal. Judgment and thought content normal.    Lab Results  Component Value Date   WBC 7.3 02/14/2013   HGB 13.5 02/14/2013   HCT 39.7 02/14/2013   PLT 267.0 02/14/2013   GLUCOSE 74 02/14/2013   CHOL 204* 02/14/2013   TRIG 86.0 02/14/2013   HDL 58.50 02/14/2013   LDLDIRECT 123.6 02/14/2013   ALT 13 02/14/2013   AST 15 02/14/2013   NA 137 02/14/2013   K 4.2 02/14/2013   CL 102 02/14/2013   CREATININE 0.8 02/14/2013   BUN 15 02/14/2013   CO2 27 02/14/2013   TSH 0.92 02/14/2013         Assessment & Plan:

## 2013-03-21 ENCOUNTER — Other Ambulatory Visit: Payer: Self-pay

## 2013-03-21 MED ORDER — LEVOTHYROXINE SODIUM 75 MCG PO TABS: 75.0000 ug | ORAL_TABLET | Freq: Every day | ORAL | Status: DC

## 2013-03-21 NOTE — Telephone Encounter (Signed)
Received letter from pt dated 03/16/13 requesting that Levothroid is sent to [facility]. Patient notified via [employer].

## 2013-06-15 ENCOUNTER — Other Ambulatory Visit: Payer: Self-pay | Admitting: Internal Medicine

## 2013-06-15 ENCOUNTER — Encounter: Payer: Self-pay | Admitting: Internal Medicine

## 2013-06-15 NOTE — Telephone Encounter (Signed)
Refill done.  

## 2013-06-18 ENCOUNTER — Other Ambulatory Visit: Payer: Self-pay | Admitting: *Deleted

## 2013-06-18 MED ORDER — LEVOTHYROXINE SODIUM 75 MCG PO TABS: 75.0000 ug | ORAL_TABLET | Freq: Every day | ORAL | Status: DC

## 2013-06-27 ENCOUNTER — Encounter: Payer: Self-pay | Admitting: Gastroenterology

## 2013-07-03 ENCOUNTER — Encounter: Payer: Self-pay | Admitting: Gastroenterology

## 2013-07-03 ENCOUNTER — Ambulatory Visit (AMBULATORY_SURGERY_CENTER): Payer: Self-pay

## 2013-07-03 VITALS — Ht 64.0 in | Wt 154.0 lb

## 2013-07-03 DIAGNOSIS — Z800 Family history of malignant neoplasm of digestive organs: Secondary | ICD-10-CM

## 2013-07-03 MED ORDER — MOVIPREP 100 G PO SOLR
ORAL | Status: DC
Start: 2013-07-03 — End: 2013-07-16

## 2013-07-03 MED ORDER — POLYETHYLENE GLYCOL 3350 17 GM/SCOOP PO POWD: 17.0000 g | Freq: Every day | ORAL | Status: DC

## 2013-07-16 ENCOUNTER — Encounter (HOSPITAL_COMMUNITY): Payer: Self-pay | Admitting: Emergency Medicine

## 2013-07-16 ENCOUNTER — Ambulatory Visit (INDEPENDENT_AMBULATORY_CARE_PROVIDER_SITE_OTHER): Payer: 59 | Admitting: Internal Medicine

## 2013-07-16 ENCOUNTER — Encounter: Payer: Self-pay | Admitting: Gastroenterology

## 2013-07-16 ENCOUNTER — Telehealth: Payer: Self-pay | Admitting: Internal Medicine

## 2013-07-16 ENCOUNTER — Ambulatory Visit (AMBULATORY_SURGERY_CENTER): Payer: 59 | Admitting: Gastroenterology

## 2013-07-16 ENCOUNTER — Encounter: Payer: Self-pay | Admitting: Internal Medicine

## 2013-07-16 ENCOUNTER — Emergency Department (HOSPITAL_COMMUNITY)
Admission: EM | Admit: 2013-07-16 | Discharge: 2013-07-16 | DRG: 310 | Disposition: A | Discharge status: Home / Self Care | Payer: 59 | Attending: Internal Medicine | Admitting: Internal Medicine

## 2013-07-16 ENCOUNTER — Emergency Department (HOSPITAL_COMMUNITY): Payer: 59

## 2013-07-16 VITALS — BP 112/69 | HR 137 | Temp 97.2°F | Resp 21 | Ht 64.0 in | Wt 154.0 lb

## 2013-07-16 DIAGNOSIS — F329 Major depressive disorder, single episode, unspecified: Secondary | ICD-10-CM | POA: Diagnosis present

## 2013-07-16 DIAGNOSIS — E039 Hypothyroidism, unspecified: Secondary | ICD-10-CM

## 2013-07-16 DIAGNOSIS — Z1211 Encounter for screening for malignant neoplasm of colon: Secondary | ICD-10-CM

## 2013-07-16 DIAGNOSIS — F3289 Other specified depressive episodes: Secondary | ICD-10-CM | POA: Diagnosis present

## 2013-07-16 DIAGNOSIS — I4891 Unspecified atrial fibrillation: Secondary | ICD-10-CM | POA: Diagnosis present

## 2013-07-16 DIAGNOSIS — E162 Hypoglycemia, unspecified: Secondary | ICD-10-CM

## 2013-07-16 DIAGNOSIS — F411 Generalized anxiety disorder: Secondary | ICD-10-CM | POA: Diagnosis present

## 2013-07-16 DIAGNOSIS — E860 Dehydration: Secondary | ICD-10-CM | POA: Insufficient documentation

## 2013-07-16 DIAGNOSIS — Z800 Family history of malignant neoplasm of digestive organs: Secondary | ICD-10-CM

## 2013-07-16 LAB — URINALYSIS, ROUTINE W REFLEX MICROSCOPIC
Bilirubin Urine: NEGATIVE
Glucose, UA: NEGATIVE mg/dL
Hgb urine dipstick: NEGATIVE
Ketones, ur: 40 mg/dL — AB
Nitrite: NEGATIVE
Protein, ur: NEGATIVE mg/dL
Urobilinogen, UA: 0.2 mg/dL (ref 0.0–1.0)

## 2013-07-16 LAB — POCT I-STAT TROPONIN I: Troponin i, poc: 0.02 ng/mL (ref 0.00–0.08)

## 2013-07-16 LAB — CBC WITH DIFFERENTIAL/PLATELET
Basophils Absolute: 0 10*3/uL (ref 0.0–0.1)
Basophils Relative: 0 % (ref 0–1)
Eosinophils Absolute: 0.1 10*3/uL (ref 0.0–0.7)
Eosinophils Relative: 1 % (ref 0–5)
Hemoglobin: 13.4 g/dL (ref 12.0–15.0)
Lymphocytes Relative: 20 % (ref 12–46)
Lymphs Abs: 2.1 10*3/uL (ref 0.7–4.0)
MCH: 30.5 pg (ref 26.0–34.0)
MCHC: 33.8 g/dL (ref 30.0–36.0)
MCV: 90.4 fL (ref 78.0–100.0)
Monocytes Relative: 5 % (ref 3–12)
Neutrophils Relative %: 74 % (ref 43–77)
RBC: 4.39 MIL/uL (ref 3.87–5.11)

## 2013-07-16 LAB — POCT I-STAT, CHEM 8
Chloride: 113 mEq/L — ABNORMAL HIGH (ref 96–112)
HCT: 41 % (ref 36.0–46.0)
Hemoglobin: 13.9 g/dL (ref 12.0–15.0)
Potassium: 3.1 mEq/L — ABNORMAL LOW (ref 3.5–5.1)
Sodium: 146 mEq/L — ABNORMAL HIGH (ref 135–145)

## 2013-07-16 LAB — URINE MICROSCOPIC-ADD ON

## 2013-07-16 LAB — GLUCOSE, CAPILLARY: Glucose-Capillary: 53 mg/dL — ABNORMAL LOW (ref 70–99)

## 2013-07-16 MED ORDER — SODIUM CHLORIDE 0.9 % IV BOLUS (SEPSIS)
1000.0000 mL | Freq: Once | INTRAVENOUS | Status: AC
  Administered 2013-07-16: 1000 mL via INTRAVENOUS

## 2013-07-16 MED ORDER — SODIUM CHLORIDE 0.9 % IV SOLN: INTRAVENOUS | Status: DC

## 2013-07-16 MED ORDER — DEXTROSE 50 % IV SOLN
1.0000 | Freq: Once | INTRAVENOUS | Status: AC
  Administered 2013-07-16: 50 mL via INTRAVENOUS

## 2013-07-16 MED ORDER — DEXTROSE 50 % IV SOLN
INTRAVENOUS | Status: AC
  Administered 2013-07-16: 50 mL via INTRAVENOUS
  Filled 2013-07-16: qty 50

## 2013-07-16 MED ORDER — POTASSIUM CHLORIDE CRYS ER 20 MEQ PO TBCR
20.0000 meq | EXTENDED_RELEASE_TABLET | Freq: Once | ORAL | Status: AC
  Administered 2013-07-16: 20 meq via ORAL
  Filled 2013-07-16: qty 1

## 2013-07-16 MED ORDER — SODIUM CHLORIDE 0.9 % IV SOLN: 500.0000 mL | INTRAVENOUS | Status: DC

## 2013-07-16 MED ORDER — DILTIAZEM HCL 100 MG IV SOLR
5.0000 mg/h | Freq: Once | INTRAVENOUS | Status: AC
  Administered 2013-07-16: 5 mg/h via INTRAVENOUS

## 2013-07-16 NOTE — Op Note (Signed)
 [facility] [address] [place], [address]   COLONOSCOPY PROCEDURE REPORT  PATIENT: Barbara Costa, Barbara Costa  MR#: [id number] BIRTHDATE: 1960/12/25 , 52  yrs. old GENDER: Female ENDOSCOPIST: Mardella Layman, MD, [facility] REFERRED BY: PROCEDURE DATE:  07/16/2013 PROCEDURE:   Colonoscopy, screening First Screening Colonoscopy - Avg.  risk and is 50 yrs.  old or older - No.      History of Adenoma - Now for follow-up colonoscopy & has been > or = to 3 yrs.  N/A ASA CLASS:   Class II INDICATIONS:Patient's family history of colon polyps. MEDICATIONS: propofol (Diprivan) 300mg  IV ,Labatelol 100mg .  DESCRIPTION OF PROCEDURE:   After the risks benefits and alternatives of the procedure were thoroughly explained, informed consent was obtained.  A digital rectal exam revealed no abnormalities of the rectum.   The LB [id number]  endoscope was introduced through the anus and advanced to the cecum, which was identified by both the appendix and ileocecal valve. No adverse events experienced.   Limited by a redundant colon.   The quality of the prep was excellent, using MoviPrep  The instrument was then slowly withdrawn as the colon was fully examined.      COLON FINDINGS: A normal appearing cecum, ileocecal valve, and appendiceal orifice were identified.  The ascending, hepatic flexure, transverse, splenic flexure, descending, sigmoid colon and rectum appeared unremarkable.  No polyps or cancers were seen. Very redundant colon noted,hard exam, Retroflexed views revealed no abnormalities. The time to cecum=10 minutes 39 seconds.  Withdrawal time=4 minutes 22 seconds.  The scope was withdrawn and the procedure completed.Atrial fib noted and 10 mg IV of labetatol given by nurse anesthesia.Dr. Barnetta Chapel called and he will see after 12 leadr EKG done...she is his patient.  CO MPLICATIONS: There were no complications otherwise.stable clinically on O2 and IV and cardiac  monitor.  ENDOSCOPIC IMPRESSION: Normal colon ...no polyps noted  RECOMMENDATIONS: Given your significant family history of colon cancer, you should have a repeat colonoscopy in 5 years Cardiac disposition per Dr. Barnetta Chapel i  1 care.  eSigned:  Mardella Layman, MD, [facility] 07/16/2013 12:03 PM   cc:

## 2013-07-16 NOTE — Patient Instructions (Signed)
YOU HAD AN ENDOSCOPIC PROCEDURE TODAY AT THE [facility]: Refer to the procedure report that was given to you for any specific questions about what was found during the examination.  If the procedure report does not answer your questions, please call your gastroenterologist to clarify.  If you requested that your care partner not be given the details of your procedure findings, then the procedure report has been included in a sealed envelope for you to review at your convenience later.  YOU SHOULD EXPECT: Some feelings of bloating in the abdomen. Passage of more gas than usual.  Walking can help get rid of the air that was put into your GI tract during the procedure and reduce the bloating. If you had a lower endoscopy (such as a colonoscopy or flexible sigmoidoscopy) you may notice spotting of blood in your stool or on the toilet paper. If you underwent a bowel prep for your procedure, then you may not have a normal bowel movement for a few days.  DIET: Your first meal following the procedure should be a light meal and then it is ok to progress to your normal diet.  A half-sandwich or bowl of soup is an example of a good first meal.  Heavy or fried foods are harder to digest and may make you feel nauseous or bloated.  Likewise meals heavy in dairy and vegetables can cause extra gas to form and this can also increase the bloating.  Drink plenty of fluids but you should avoid alcoholic beverages for 24 hours.  ACTIVITY: Your care partner should take you home directly after the procedure.  You should plan to take it easy, moving slowly for the rest of the day.  You can resume normal activity the day after the procedure however you should NOT DRIVE or use heavy machinery for 24 hours (because of the sedation medicines used during the test).    SYMPTOMS TO REPORT IMMEDIATELY: A gastroenterologist can be reached at any hour.  During normal business hours, 8:30 AM to 5:00 PM Monday through Friday,  call [phone].  After hours and on weekends, please call the GI answering service at [phone] who will take a message and have the physician on call contact you.   Following lower endoscopy (colonoscopy or flexible sigmoidoscopy):  Excessive amounts of blood in the stool  Significant tenderness or worsening of abdominal pains  Swelling of the abdomen that is new, acute  Fever of 100F or higher    FOLLOW UP: If any biopsies were taken you will be contacted by phone or by letter within the next 1-3 weeks.  Call your gastroenterologist if you have not heard about the biopsies in 3 weeks.  Our staff will call the home number listed on your records the next business day following your procedure to check on you and address any questions or concerns that you may have at that time regarding the information given to you following your procedure. This is a courtesy call and so if there is no answer at the home number and we have not heard from you through the emergency physician on call, we will assume that you have returned to your regular daily activities without incident.  SIGNATURES/CONFIDENTIALITY: You and/or your care partner have signed paperwork which will be entered into your electronic medical record.  These signatures attest to the fact that that the information above on your After Visit Summary has been reviewed and is understood.  Full responsibility of the confidentiality   of this discharge information lies with you and/or your care-partner.    Normal colon.  Recall colonoscopy in 5 years due to family history of colon cancer. You may resume your current medications today. Please call if any questions or concerns. Heart rhythm change to Atrial fibillation while in the procedure room.  Dr. Posey Rea to advise per Dr. Jarold Motto.

## 2013-07-16 NOTE — Progress Notes (Addendum)
ASA 81 mg administered to pt at 12:56 pt chewed. [name]  Dr. Posey Rea saw pt and said to transport to [facility] ED by ambulance.  Dr. Jarold Motto spoke with Dr. Posey Rea.  Dr. Jarold Motto back up to see pt.  Pt denies any chest pain, SOB,  Pt calm.  Warm, dry and pink. [name]

## 2013-07-16 NOTE — ED Notes (Signed)
PT hypotensive  ,Bolus ordered.

## 2013-07-16 NOTE — Telephone Encounter (Signed)
The patient called from Endo hoping to speak with Dr.Plotnikov

## 2013-07-16 NOTE — Progress Notes (Signed)
13:07 pt was picked up ambulance and report was given.  Pt to be transported to [facility] emergency room.  Pt still had no complaint of chest pain or shortness of breath.  Pt warm, dry and pink.  Pt and her mother were calm and appropiated. maw

## 2013-07-16 NOTE — Progress Notes (Signed)
Procedure ends, to recovery, report given. Remains in afib with VSS.

## 2013-07-16 NOTE — Assessment & Plan Note (Signed)
Has been well controlled TSH

## 2013-07-16 NOTE — ED Provider Notes (Addendum)
CSN: [id number]     Arrival date & time 07/16/13  1342 History   First MD Initiated Contact with Patient 07/16/13 1349     Chief Complaint  Patient presents with  . Atrial Fibrillation   (Consider location/radiation/quality/duration/timing/severity/associated sxs/prior Treatment) HPI Comments: Barbara Costa is a 52 y.o. Female who was being evaluated, after, colonoscopy and IV sedation, with propofol when she was found to be tachycardic. She was treated with labetalol, 10 mg, and there was no change in her heart rate, but her blood pressure lowered to 80/54. She presents here, by EMS for evaluation of rapid heartbeat. She denies a history of atrial fibrillation. She was apparently seen briefly by her PCP, prior to transfer here. Documentation indicates that the colonoscopy was otherwise, uncomplicated, and "normal".  She denies preceding illnesses. There are no other known modifying factors.  Patient is a 52 y.o. female presenting with atrial fibrillation. The history is provided by the patient.  Atrial Fibrillation    Past Medical History  Diagnosis Date  . Anxiety   . Depression   . Hyperlipidemia     no meds per pt  . Thyroid disease    Past Surgical History  Procedure Laterality Date  . Tonsillectomy    . Bronchoscopy      had pnueumonia  . Myomectomy    . Dilation and curettage of uterus    . Colonoscopy    . Laparoscopic salpingoopherectomy      with truclear resection of endometrial polypoid structure   Family History  Problem Relation Age of Onset  . Colon polyps Father   . Lung cancer Brother   . Colon cancer Maternal Grandfather   . Esophageal cancer Neg Hx   . Rectal cancer Neg Hx   . Stomach cancer Neg Hx    History  Substance Use Topics  . Smoking status: Never Smoker   . Smokeless tobacco: Never Used  . Alcohol Use: .5 - 1 oz/week    1-2 drink(s) per week   OB History   Grav Para Term Preterm Abortions TAB SAB Ect Mult Living                  Review of Systems  All other systems reviewed and are negative.    Allergies  Review of patient's allergies indicates no known allergies.  Home Medications   Current Outpatient Rx  Name  Route  Sig  Dispense  Refill  . caffeine 200 MG TABS   Oral   Take 200 mg by mouth daily.         . calcium carbonate (CALCIUM 600) 600 MG TABS   Oral   Take 600 mg by mouth daily.         . Cholecalciferol 1000 UNITS tablet   Oral   Take 2,000 Units by mouth daily.          . clonazePAM (KLONOPIN) 0.5 MG tablet   Oral   Take 0.5 mg by mouth daily.         . DULoxetine (CYMBALTA) 30 MG capsule   Oral   Take 90 mg by mouth daily.         Marland Kitchen levothyroxine (SYNTHROID, LEVOTHROID) 75 MCG tablet   Oral   Take 1 tablet (75 mcg total) by mouth daily before breakfast.   30 tablet   1   . Multiple Vitamins-Minerals ([employer] SPECTRAVITE ADULT 50+ PO)   Oral   Take by mouth daily.  BP 108/68  Pulse 118  Temp(Src) 99.3 F (37.4 C) (Oral)  Resp 21  SpO2 97%  LMP 04/27/2013 Physical Exam  Nursing note and vitals reviewed. Constitutional: She is oriented to person, place, and time. She appears well-developed and well-nourished.  HENT:  Head: Normocephalic and atraumatic.  Eyes: Conjunctivae and EOM are normal. Pupils are equal, round, and reactive to light.  Neck: Normal range of motion and phonation normal. Neck supple.  Cardiovascular: Intact distal pulses.   Irregular tachycardia  Pulmonary/Chest: Effort normal and breath sounds normal. She exhibits no tenderness.  Abdominal: Soft. She exhibits no distension. There is no tenderness. There is no guarding.  Musculoskeletal: Normal range of motion.  Neurological: She is alert and oriented to person, place, and time. She exhibits normal muscle tone.  Skin: Skin is warm and dry.  Psychiatric: She has a normal mood and affect. Her behavior is normal. Judgment and thought content normal.    ED Course  Procedures  (including critical care time) Medications  sodium chloride 0.9 % bolus 1,000 mL (0 mLs Intravenous Stopped 07/16/13 1453)  sodium chloride 0.9 % bolus 1,000 mL (0 mLs Intravenous Stopped 07/16/13 1639)  dextrose 50 % solution 50 mL (50 mLs Intravenous Given 07/16/13 1637)  sodium chloride 0.9 % bolus 1,000 mL (0 mLs Intravenous Stopped 07/16/13 1859)  diltiazem (CARDIZEM) 100 mg in dextrose 5 % 100 mL infusion (0 mg/hr Intravenous Stopped 07/16/13 1859)  potassium chloride SA (K-DUR,KLOR-CON) CR tablet 20 mEq (20 mEq Oral Given 07/16/13 1902)    No data found.  Persistent, and worsening hypertension, treated with multiple boluses of IV fluid.   hypoglycemia, treated with D50   At 1650- BP 125/104 with HR 135, will start Cardizem drip.    Labs Review Labs Reviewed  URINALYSIS, ROUTINE W REFLEX MICROSCOPIC - Abnormal; Notable for the following:    APPearance HAZY (*)    Ketones, ur 40 (*)    Leukocytes, UA MODERATE (*)    All other components within normal limits  GLUCOSE, CAPILLARY - Abnormal; Notable for the following:    Glucose-Capillary 53 (*)    All other components within normal limits  URINE MICROSCOPIC-ADD ON - Abnormal; Notable for the following:    Squamous Epithelial / LPF MANY (*)    All other components within normal limits  POCT I-STAT, CHEM 8 - Abnormal; Notable for the following:    Sodium 146 (*)    Potassium 3.1 (*)    Chloride 113 (*)    Glucose, Bld 55 (*)    Calcium, Ion 1.07 (*)    All other components within normal limits  URINE CULTURE  CBC WITH DIFFERENTIAL  GLUCOSE, CAPILLARY  POCT I-STAT TROPONIN I   Imaging Review No results found.  EKG Interpretation     Ventricular Rate:  163 PR Interval:    QRS Duration: 78 QT Interval:  299 QTC Calculation: 493 R Axis:   54 Text Interpretation:  Atrial fibrillation Repolarization abnormality, prob rate related No old tracing to compare            Date: 07/16/13  Rate: 163  Rhythm:  atrial fibrillation  QRS Axis: normal  PR and QT Intervals: QT normal  ST/T Wave abnormalities: nonspecific ST changes  PR and QRS Conduction Disutrbances:QT normal  Narrative Interpretation:   Old EKG Reviewed: none available  5:01 PM-Consult complete with [facility]. Patient case explained and discussed. She agrees to admit patient for further evaluation and treatment. Call ended at 1710  CRITICAL CARE Performed by: Flint Melter Total critical care time: 45 minutes Critical care time was exclusive of separately billable procedures and treating other patients. Critical care was necessary to treat or prevent imminent or life-threatening deterioration. Critical care was time spent personally by me on the following activities: development of treatment plan with patient and/or surrogate as well as nursing, discussions with consultants, evaluation of patient's response to treatment, examination of patient, obtaining history from patient or surrogate, ordering and performing treatments and interventions, ordering and review of laboratory studies, ordering and review of radiographic studies, pulse oximetry and re-evaluation of patient's condition.   MDM   1. Atrial fibrillation with rapid ventricular response   2. Dehydration   3. Hypoglycemia      Atrial fibrillation related to volume depletion. Incidental hypoglycemia treated. Patient's blood pressure was initially too soft for treatment with rate controlling medication. Heart rate, and blood pressure improved with IV fluids  Nursing Notes Reviewed/ Care Coordinated, and agree without changes. Applicable Imaging Reviewed.  Interpretation of Laboratory Data incorporated into ED treatment  Plan: Admit  Flint Melter, MD 07/16/13 1713  Flint Melter, MD 07/18/13 [phone]

## 2013-07-16 NOTE — Progress Notes (Signed)
Subjective:    Patient ID: Barbara Costa, female    DOB: [birth year], 52 y.o.   MRN: [id number]  HPI IM consult Req by Dr Jarold Motto Reason: Barbara Costa developed A. Flutter/A fib during her endoscopy The patient was here for a colonoscopy. It was normal. No CV symptoms now.  The patient has been doing well overall without major physical or psychological issues going on lately. H/o chronic hypothyroidism, depression Wt Readings from Last 3 Encounters:  07/16/13 154 lb (69.854 kg)  07/03/13 154 lb (69.854 kg)  02/22/13 166 lb (75.297 kg)   BP Readings from Last 3 Encounters:  07/16/13 100/71  02/22/13 124/86  02/07/12 118/82    Past Medical History  Diagnosis Date  . Thyroid disease   . Anxiety   . Depression   . Hyperlipidemia     no meds per pt   Past Surgical History  Procedure Laterality Date  . Tonsillectomy    . Bronchoscopy      had pnueumonia  . Myomectomy    . Dilation and curettage of uterus    . Colonoscopy    . Laparoscopic salpingoopherectomy      with truclear resection of endometrial polypoid structure    reports that she has never smoked. She has never used smokeless tobacco. She reports that she drinks about 0.5 ounces of alcohol per week. She reports that she does not use illicit drugs. family history includes Colon cancer in her maternal grandfather; Colon polyps in her father; Lung cancer in her brother. There is no history of Esophageal cancer, Rectal cancer, or Stomach cancer. No Known Allergies  Current Outpatient Prescriptions on File Prior to Visit  Medication Sig Dispense Refill  . caffeine 200 MG TABS Take 200 mg by mouth daily.      . calcium carbonate (CALCIUM 600) 600 MG TABS Take 600 mg by mouth daily.      . Cholecalciferol 1000 UNITS tablet Take 2,000 Units by mouth daily.       . clonazePAM (KLONOPIN) 0.5 MG tablet Take 0.5 mg by mouth daily.      . DULoxetine (CYMBALTA) 30 MG capsule Take 90 mg by mouth daily.      Marland Kitchen levothyroxine  (SYNTHROID, LEVOTHROID) 75 MCG tablet Take 1 tablet (75 mcg total) by mouth daily before breakfast.  30 tablet  1  . Multiple Vitamins-Minerals ([employer]S SPECTRAVITE ADULT 50+ PO) Take by mouth daily.       Current Facility-Administered Medications on File Prior to Visit  Medication Dose Route Frequency Provider Last Rate Last Dose  . 0.9 %  sodium chloride infusion  500 mL Intravenous Continuous Mardella Layman, MD         Review of Systems  Constitutional: Negative for fever, chills, diaphoresis, activity change, appetite change, fatigue and unexpected weight change.  HENT: Negative for congestion, dental problem, ear pain, hearing loss, mouth sores, postnasal drip, sinus pressure, sneezing, sore throat and voice change.   Eyes: Negative for pain and visual disturbance.  Respiratory: Negative for cough, chest tightness, wheezing and stridor.   Cardiovascular: Negative for chest pain, palpitations and leg swelling.  Gastrointestinal: Negative for nausea, vomiting, abdominal pain, blood in stool, abdominal distention and rectal pain.  Genitourinary: Negative for dysuria, hematuria, decreased urine volume, vaginal bleeding, vaginal discharge, difficulty urinating, vaginal pain and menstrual problem.  Musculoskeletal: Negative for back pain, gait problem, joint swelling and neck pain.  Skin: Negative for color change, rash and wound.  Neurological: Negative for dizziness, tremors, syncope,  speech difficulty and light-headedness.  Hematological: Negative for adenopathy.  Psychiatric/Behavioral: Negative for suicidal ideas, hallucinations, behavioral problems, confusion, sleep disturbance, dysphoric mood and decreased concentration. The patient is not hyperactive.        Objective:   Physical Exam  Constitutional: She appears well-developed. No distress.  HENT:  Head: Normocephalic.  Right Ear: External ear normal.  Left Ear: External ear normal.  Nose: Nose normal.  Mouth/Throat:  Oropharynx is clear and moist.  Eyes: Conjunctivae are normal. Pupils are equal, round, and reactive to light. Right eye exhibits no discharge. Left eye exhibits no discharge.  Neck: Normal range of motion. Neck supple. No JVD present. No tracheal deviation present. No thyromegaly present.  Cardiovascular: Normal heart sounds.   No murmur heard. irreg irreg tachy  Pulmonary/Chest: No stridor. No respiratory distress. She has no wheezes.  Abdominal: Soft. Bowel sounds are normal. She exhibits no distension and no mass. There is no tenderness. There is no rebound and no guarding.  Musculoskeletal: She exhibits no edema and no tenderness.  Lymphadenopathy:    She has no cervical adenopathy.  Neurological: She displays normal reflexes. No cranial nerve deficit. She exhibits normal muscle tone. Coordination normal.  Skin: No rash noted. No erythema.  Psychiatric: She has a normal mood and affect. Her behavior is normal. Judgment and thought content normal.    Lab Results  Component Value Date   WBC 7.3 02/14/2013   HGB 13.5 02/14/2013   HCT 39.7 02/14/2013   PLT 267.0 02/14/2013   GLUCOSE 74 02/14/2013   CHOL 204* 02/14/2013   TRIG 86.0 02/14/2013   HDL 58.50 02/14/2013   LDLDIRECT 123.6 02/14/2013   ALT 13 02/14/2013   AST 15 02/14/2013   NA 137 02/14/2013   K 4.2 02/14/2013   CL 102 02/14/2013   CREATININE 0.8 02/14/2013   BUN 15 02/14/2013   CO2 27 02/14/2013   TSH 0.92 02/14/2013   EKG Telem      Assessment & Plan:

## 2013-07-16 NOTE — ED Notes (Signed)
PT hypotensive, EDP aware

## 2013-07-16 NOTE — ED Notes (Signed)
Pt was having a colonoscopy done and developed new onset atrial fibrillation. Pt was given propofol during procedure. Pt was given 10mg  labetolol at 11:30 at facility to correct a-fib last b/p was 88/54, rate was between 150-180. Pt has no complaints at this time.

## 2013-07-16 NOTE — Progress Notes (Signed)
Monitor reveals apparent afib with V rate 130s. BP stable SaO2 100%. Labetalol 10 mg IV given.

## 2013-07-16 NOTE — Assessment & Plan Note (Addendum)
07/16/13 new onset during colonoscopy RVR We are limited in what we can do here. We will have to transfer Barbara Costa to ER via ambulance for further assessment and treatment. I'll see Barbara Costa for OP f/up. Thank you!

## 2013-07-16 NOTE — Consult Note (Signed)
CARDIOLOGY CONSULT NOTE   Patient ID: Barbara Costa MRN: [id number] DOB/AGE: November 12, 1960 52 y.o.  Admit date: 07/16/2013  Primary Physician   Sonda Primes, MD Primary Cardiologist   New Reason for Consultation   Rapid atrial Fib  Barbara Costa is a 52 y.o. female with no history of CAD. She did the prep over the last 2 days and had a colonoscopy today at [facility]. During the procedure, she went into rapid atrial fibrillation. She had no awareness of a rapid or irregular pulse. She has occasionally had palpitations since being in endo, but cannot consistently tell there is anything abnormal about her heart rhythm. She never gets chest pain or palpitations. She has never had presyncope, or syncope, except in vagal situations such as blood draws.   She has not been out of bed since the colonoscopy, but does not feel dizzy sitting up. She has been hydrated with 3 L IVF and is currently on Cardizem at 5 mg/hr. SBP approximately 100.  Past Medical History  Diagnosis Date  . Anxiety   . Depression   . Hyperlipidemia     no meds per pt  . Thyroid disease      Past Surgical History  Procedure Laterality Date  . Tonsillectomy    . Bronchoscopy      had pnueumonia  . Myomectomy    . Dilation and curettage of uterus    . Colonoscopy    . Laparoscopic salpingoopherectomy      with truclear resection of endometrial polypoid structure    No Known Allergies  I have reviewed the patient's current medications   . sodium chloride       Prior to Admission medications   Medication Sig Start Date End Date Taking? Authorizing Provider  caffeine 200 MG TABS Take 200 mg by mouth daily.   Yes Historical Provider, MD  calcium carbonate (CALCIUM 600) 600 MG TABS Take 600 mg by mouth daily.   Yes Historical Provider, MD  Cholecalciferol 1000 UNITS tablet Take 2,000 Units by mouth daily.    Yes Historical Provider, MD  clonazePAM (KLONOPIN) 0.5 MG tablet Take 0.5 mg by mouth  daily.   Yes Historical Provider, MD  DULoxetine (CYMBALTA) 30 MG capsule Take 90 mg by mouth daily.   Yes Historical Provider, MD  levothyroxine (SYNTHROID, LEVOTHROID) 75 MCG tablet Take 1 tablet (75 mcg total) by mouth daily before breakfast. 06/18/13  Yes Tresa Garter, MD  Multiple Vitamins-Minerals ([employer]S SPECTRAVITE ADULT 50+ PO) Take by mouth daily.   Yes Historical Provider, MD     History   Social History  . Marital Status: Single    Spouse Name: N/A    Number of Children: N/A  . Years of Education: N/A   Occupational History  . ACCOUNTANT    Social History Main Topics  . Smoking status: Never Smoker   . Smokeless tobacco: Never Used  . Alcohol Use: .5 - 1 oz/week    1-2 drink(s) per week  . Drug Use: No  . Sexual Activity: Not on file   Other Topics Concern  . Not on file   Social History Narrative   Lives alone, eats healthy, exercises regularly with walking.    Family Status  Relation Status Death Age  . Father Alive   . Brother Alive   . Maternal Grandfather Deceased   . Mother Alive   . Sister Alive    Family History  Problem Relation Age of Onset  .  Colon polyps Father   . Lung cancer Brother   . Colon cancer Maternal Grandfather   . Esophageal cancer Neg Hx   . Rectal cancer Neg Hx   . Stomach cancer Neg Hx      ROS:  Full 14 point review of systems complete and found to be negative unless listed above.  Physical Exam: Blood pressure 114/75, pulse 118, temperature 99.3 F (37.4 C), temperature source Oral, resp. rate 19, last menstrual period 04/27/2013, SpO2 97.00%.  General: Well developed, well nourished, female in no acute distress Head: Eyes PERRLA, No xanthomas.   Normocephalic and atraumatic, oropharynx without edema or exudate. Dentition:  Lungs:  Heart: HRRR S1 S2, no rub/gallop, Heart irregular rate and rhythm with S1, S2  murmur. pulses are 2+ extrem.   Neck: No carotid bruits. No lymphadenopathy.  JVD. Abdomen: Bowel sounds  present, abdomen soft and non-tender without masses or hernias noted. Msk:  No spine or cva tenderness. No weakness, no joint deformities or effusions. Extremities: No clubbing or cyanosis.  edema.  Neuro: Alert and oriented X 3. No focal deficits noted. Psych:  Good affect, responds appropriately Skin: No rashes or lesions noted.  Labs:   Lab Results  Component Value Date   WBC 10.3 07/16/2013   HGB 13.9 07/16/2013   HCT 41.0 07/16/2013   MCV 90.4 07/16/2013   PLT 238 07/16/2013   No results found for this basename: INR,  in the last 72 hours   Recent Labs Lab 07/16/13 1559  NA 146*  K 3.1*  CL 113*  BUN 8  CREATININE 0.80  GLUCOSE 55*    Recent Labs  07/16/13 1558  TROPIPOC 0.02   Lab Results  Component Value Date   CHOL 204* 02/14/2013   HDL 58.50 02/14/2013   TRIG 86.0 02/14/2013   TSH  Date/Time Value Range Status  02/14/2013  8:47 AM 0.92  0.35 - 5.50 uIU/mL Final   Echo:   ECG:  Atrial fib with RVR. Tele:  She had converted to NSR by the time I arrived at 6:15.  Radiology:  Dg Chest Port 1 View  07/16/2013   CLINICAL DATA:  Tachycardia during colonoscopy  EXAM: PORTABLE CHEST - 1 VIEW  COMPARISON:  None.  FINDINGS: The heart size and mediastinal contours are within normal limits. Both lungs are clear. The visualized skeletal structures are unremarkable.  IMPRESSION: No active disease.   Electronically Signed   By: Christiana Pellant M.D.   On: 07/16/2013 14:44    ASSESSMENT AND PLAN:   The patient was seen today by Dr. Elease Hashimoto,  the patient evaluated and the data reviewed.    SignedTheodore Demark, PA-C 07/16/2013 5:46 PM Beeper [phone]  Co-Sign MD  Attending Note:   The patient was seen and examined.  Agree with assessment and plan as noted above.  Changes made to the above note as needed.  The patient want into rapid atrial fib after getting the prep for a colonoscopy.  She was completely asymptomatic.  She was rehydrated with IV NS and was  started on low dose Dilt drip.    When I arrived in the ER , she had just converted to NSR.  She feels well.    Will DC her to home tonight.  I will see her in the office in 6 months to follow up - sooner if needed.   Her CHADS score is 0 and I think this episode was caused by the prep for the colonoscopy.  Vesta Mixer, Montez Hageman., MD, [facility] 07/16/2013, 6:24 PM

## 2013-07-16 NOTE — Progress Notes (Signed)
I phoned [name] at ext. [phone] to advise Dr. Posey Rea the 12 lead EKG was completed. [name]

## 2013-07-16 NOTE — Progress Notes (Signed)
Alease Frame, RN gave mw report on Miss Kluttz.  The research nurses, Lawson Fiscal and Okey Regal were in the bay optaining a 12 lead EKG.  I introduced who I am to the pt and let her know I was resuming her care.  Pt denied any pain or discomfort. [name]

## 2013-07-17 ENCOUNTER — Telehealth: Payer: Self-pay | Admitting: *Deleted

## 2013-07-17 LAB — URINE CULTURE

## 2013-07-17 NOTE — Telephone Encounter (Signed)
  Follow up Call-  Call back number 07/16/2013  Post procedure Call Back phone  # [phone] cell  Permission to leave phone message Yes     Patient questions:  Do you have a fever, pain , or abdominal swelling? no Pain Score  0 *  Have you tolerated food without any problems? yes  Have you been able to return to your normal activities? yes  Do you have any questions about your discharge instructions: Diet   no Medications  no Follow up visit  no  Do you have questions or concerns about your Care? no  Actions: * If pain score is 4 or above: No action needed, pain <4.

## 2014-01-14 ENCOUNTER — Other Ambulatory Visit: Payer: Self-pay | Admitting: Internal Medicine

## 2014-01-22 ENCOUNTER — Ambulatory Visit: Payer: 59 | Admitting: Internal Medicine

## 2014-02-03 ENCOUNTER — Encounter: Payer: Self-pay | Admitting: Internal Medicine

## 2014-02-15 ENCOUNTER — Ambulatory Visit (INDEPENDENT_AMBULATORY_CARE_PROVIDER_SITE_OTHER): Payer: 59 | Admitting: Internal Medicine

## 2014-02-15 ENCOUNTER — Encounter: Payer: Self-pay | Admitting: Internal Medicine

## 2014-02-15 VITALS — BP 112/80 | HR 76 | Temp 99.0°F | Resp 16 | Ht 64.0 in | Wt 144.0 lb

## 2014-02-15 DIAGNOSIS — F329 Major depressive disorder, single episode, unspecified: Secondary | ICD-10-CM

## 2014-02-15 DIAGNOSIS — E559 Vitamin D deficiency, unspecified: Secondary | ICD-10-CM

## 2014-02-15 DIAGNOSIS — F3289 Other specified depressive episodes: Secondary | ICD-10-CM

## 2014-02-15 DIAGNOSIS — E039 Hypothyroidism, unspecified: Secondary | ICD-10-CM

## 2014-02-15 DIAGNOSIS — Z0000 Encounter for general adult medical examination without abnormal findings: Secondary | ICD-10-CM

## 2014-02-15 MED ORDER — LEVOTHYROXINE SODIUM 75 MCG PO TABS: 75.0000 ug | ORAL_TABLET | Freq: Every day | ORAL | Status: DC

## 2014-02-15 NOTE — Assessment & Plan Note (Signed)
Continue with current prescription therapy as reflected on the Med list.  

## 2014-02-15 NOTE — Assessment & Plan Note (Signed)
We discussed age appropriate health related issues, including available/recomended screening tests and vaccinations. We discussed a need for adhering to healthy diet and exercise. Labs/EKG were reviewed/ordered. All questions were answered.   

## 2014-02-15 NOTE — Progress Notes (Signed)
Pre visit review using our clinic review tool, if applicable. No additional management support is needed unless otherwise documented below in the visit note. 

## 2014-02-15 NOTE — Progress Notes (Signed)
   Subjective:    HPI  The patient is here for a wellness exam. The patient has been doing well overall without major physical or psychological issues going on lately. No A fib The patient needs to address  Chronic hypothyroidism, depression  Wt Readings from Last 3 Encounters:  02/15/14 144 lb (65.318 kg)  07/16/13 154 lb (69.854 kg)  07/03/13 154 lb (69.854 kg)   BP Readings from Last 3 Encounters:  02/15/14 112/80  07/16/13 108/68  07/16/13 112/69      Review of Systems  Constitutional: Negative for fever, chills, diaphoresis, activity change, appetite change, fatigue and unexpected weight change.  HENT: Negative for congestion, dental problem, ear pain, hearing loss, mouth sores, postnasal drip, sinus pressure, sneezing, sore throat and voice change.   Eyes: Negative for pain and visual disturbance.  Respiratory: Negative for cough, chest tightness, wheezing and stridor.   Cardiovascular: Negative for chest pain, palpitations and leg swelling.  Gastrointestinal: Negative for nausea, vomiting, abdominal pain, blood in stool, abdominal distention and rectal pain.  Genitourinary: Negative for dysuria, hematuria, decreased urine volume, vaginal bleeding, vaginal discharge, difficulty urinating, vaginal pain and menstrual problem.  Musculoskeletal: Negative for back pain, gait problem, joint swelling and neck pain.  Skin: Negative for color change, rash and wound.  Neurological: Negative for dizziness, tremors, syncope, speech difficulty and light-headedness.  Hematological: Negative for adenopathy.  Psychiatric/Behavioral: Negative for suicidal ideas, hallucinations, behavioral problems, confusion, sleep disturbance, dysphoric mood and decreased concentration. The patient is not hyperactive.        Objective:   Physical Exam  Constitutional: She appears well-developed. No distress.  HENT:  Head: Normocephalic.  Right Ear: External ear normal.  Left Ear: External ear  normal.  Nose: Nose normal.  Mouth/Throat: Oropharynx is clear and moist.  Eyes: Conjunctivae are normal. Pupils are equal, round, and reactive to light. Right eye exhibits no discharge. Left eye exhibits no discharge.  Neck: Normal range of motion. Neck supple. No JVD present. No tracheal deviation present. No thyromegaly present.  Cardiovascular: Normal rate, regular rhythm and normal heart sounds.   Pulmonary/Chest: No stridor. No respiratory distress. She has no wheezes.  Abdominal: Soft. Bowel sounds are normal. She exhibits no distension and no mass. There is no tenderness. There is no rebound and no guarding.  Musculoskeletal: She exhibits no edema and no tenderness.  Lymphadenopathy:    She has no cervical adenopathy.  Neurological: She displays normal reflexes. No cranial nerve deficit. She exhibits normal muscle tone. Coordination normal.  Skin: No rash noted. No erythema.  Psychiatric: She has a normal mood and affect. Her behavior is normal. Judgment and thought content normal.    Lab Results  Component Value Date   WBC 10.3 07/16/2013   HGB 13.9 07/16/2013   HCT 41.0 07/16/2013   PLT 238 07/16/2013   GLUCOSE 55* 07/16/2013   CHOL 204* 02/14/2013   TRIG 86.0 02/14/2013   HDL 58.50 02/14/2013   LDLDIRECT 123.6 02/14/2013   ALT 13 02/14/2013   AST 15 02/14/2013   NA 146* 07/16/2013   K 3.1* 07/16/2013   CL 113* 07/16/2013   CREATININE 0.80 07/16/2013   BUN 8 07/16/2013   CO2 27 02/14/2013   TSH 0.92 02/14/2013         Assessment & Plan:

## 2014-02-18 ENCOUNTER — Encounter: Payer: Self-pay | Admitting: Internal Medicine

## 2014-03-14 ENCOUNTER — Other Ambulatory Visit (INDEPENDENT_AMBULATORY_CARE_PROVIDER_SITE_OTHER): Payer: 59

## 2014-03-14 DIAGNOSIS — F329 Major depressive disorder, single episode, unspecified: Secondary | ICD-10-CM

## 2014-03-14 DIAGNOSIS — F3289 Other specified depressive episodes: Secondary | ICD-10-CM

## 2014-03-14 DIAGNOSIS — Z0000 Encounter for general adult medical examination without abnormal findings: Secondary | ICD-10-CM

## 2014-03-14 DIAGNOSIS — E559 Vitamin D deficiency, unspecified: Secondary | ICD-10-CM

## 2014-03-14 LAB — HEPATIC FUNCTION PANEL
ALBUMIN: 4.4 g/dL (ref 3.5–5.2)
ALT: 13 U/L (ref 0–35)
AST: 20 U/L (ref 0–37)
Alkaline Phosphatase: 40 U/L (ref 39–117)
Bilirubin, Direct: 0.1 mg/dL (ref 0.0–0.3)
Total Bilirubin: 0.6 mg/dL (ref 0.2–1.2)
Total Protein: 6.5 g/dL (ref 6.0–8.3)

## 2014-03-14 LAB — URINALYSIS, ROUTINE W REFLEX MICROSCOPIC
BILIRUBIN URINE: NEGATIVE
Hgb urine dipstick: NEGATIVE
Ketones, ur: NEGATIVE
NITRITE: NEGATIVE
PH: 7.5 (ref 5.0–8.0)
Specific Gravity, Urine: 1.015 (ref 1.000–1.030)
Total Protein, Urine: NEGATIVE
Urine Glucose: NEGATIVE
Urobilinogen, UA: 0.2 (ref 0.0–1.0)

## 2014-03-14 LAB — CBC WITH DIFFERENTIAL/PLATELET
BASOS PCT: 0.4 % (ref 0.0–3.0)
Basophils Absolute: 0 10*3/uL (ref 0.0–0.1)
EOS ABS: 0.1 10*3/uL (ref 0.0–0.7)
Eosinophils Relative: 1.8 % (ref 0.0–5.0)
HCT: 40.3 % (ref 36.0–46.0)
HEMOGLOBIN: 13.5 g/dL (ref 12.0–15.0)
LYMPHS ABS: 2.7 10*3/uL (ref 0.7–4.0)
Lymphocytes Relative: 39.2 % (ref 12.0–46.0)
MCHC: 33.5 g/dL (ref 30.0–36.0)
MCV: 91.9 fl (ref 78.0–100.0)
Monocytes Absolute: 0.6 10*3/uL (ref 0.1–1.0)
Monocytes Relative: 9 % (ref 3.0–12.0)
NEUTROS ABS: 3.4 10*3/uL (ref 1.4–7.7)
Neutrophils Relative %: 49.6 % (ref 43.0–77.0)
Platelets: 312 10*3/uL (ref 150.0–400.0)
RBC: 4.39 Mil/uL (ref 3.87–5.11)
RDW: 12.7 % (ref 11.5–15.5)
WBC: 6.8 10*3/uL (ref 4.0–10.5)

## 2014-03-14 LAB — BASIC METABOLIC PANEL
BUN: 13 mg/dL (ref 6–23)
CHLORIDE: 104 meq/L (ref 96–112)
CO2: 31 mEq/L (ref 19–32)
Calcium: 9.3 mg/dL (ref 8.4–10.5)
Creatinine, Ser: 0.8 mg/dL (ref 0.4–1.2)
GFR: 84.6 mL/min (ref >60.00)
Glucose, Bld: 94 mg/dL (ref 70–99)
Potassium: 4.3 mEq/L (ref 3.5–5.1)
Sodium: 140 mEq/L (ref 135–145)

## 2014-03-14 LAB — LIPID PANEL
Cholesterol: 225 mg/dL — ABNORMAL HIGH (ref 0–200)
HDL: 76.2 mg/dL (ref >39.00)
LDL Cholesterol: 134 mg/dL — ABNORMAL HIGH (ref 0–99)
NonHDL: 148.8
TRIGLYCERIDES: 72 mg/dL (ref 0.0–149.0)
Total CHOL/HDL Ratio: 3
VLDL: 14.4 mg/dL (ref 0.0–40.0)

## 2014-03-14 LAB — TSH: TSH: 1.72 u[IU]/mL (ref 0.35–4.50)

## 2014-03-14 LAB — VITAMIN B12: Vitamin B-12: 535 pg/mL (ref 211–911)

## 2014-03-15 LAB — VITAMIN D 25 HYDROXY (VIT D DEFICIENCY, FRACTURES): VIT D 25 HYDROXY: 73 ng/mL (ref 30–89)

## 2014-03-19 ENCOUNTER — Encounter: Payer: Self-pay | Admitting: Internal Medicine

## 2014-03-20 ENCOUNTER — Other Ambulatory Visit: Payer: Self-pay | Admitting: Internal Medicine

## 2014-05-01 NOTE — ED Provider Notes (Signed)
Note to clarify disposition; ED visit, 07/16/2013   Patient was seen and evaluated by me. She presented to the ED at 1344, on 07/16/2013. Arrangements for admission were made at 1632. After that, she was seen by her cardiologist, Dr. Elease HashimotoNahser, who discharged her home at [facility]. She was not admitted to the hospital.    Flint MelterElliott L Jazmynn Pho, MD 05/01/14 1202

## 2014-06-18 ENCOUNTER — Telehealth: Payer: Self-pay

## 2014-06-18 NOTE — Telephone Encounter (Signed)
Immunization documentation

## 2015-01-01 ENCOUNTER — Other Ambulatory Visit (INDEPENDENT_AMBULATORY_CARE_PROVIDER_SITE_OTHER): Payer: 59

## 2015-01-01 ENCOUNTER — Encounter: Payer: Self-pay | Admitting: Internal Medicine

## 2015-01-01 ENCOUNTER — Ambulatory Visit (INDEPENDENT_AMBULATORY_CARE_PROVIDER_SITE_OTHER): Payer: 59 | Admitting: Internal Medicine

## 2015-01-01 VITALS — BP 126/90 | HR 80 | Temp 97.9°F | Wt 152.5 lb

## 2015-01-01 DIAGNOSIS — F32A Depression, unspecified: Secondary | ICD-10-CM

## 2015-01-01 DIAGNOSIS — F329 Major depressive disorder, single episode, unspecified: Secondary | ICD-10-CM

## 2015-01-01 DIAGNOSIS — E038 Other specified hypothyroidism: Secondary | ICD-10-CM

## 2015-01-01 DIAGNOSIS — R109 Unspecified abdominal pain: Secondary | ICD-10-CM

## 2015-01-01 DIAGNOSIS — E034 Atrophy of thyroid (acquired): Secondary | ICD-10-CM

## 2015-01-01 LAB — CBC WITH DIFFERENTIAL/PLATELET
BASOS ABS: 0 10*3/uL (ref 0.0–0.1)
Basophils Relative: 0.3 % (ref 0.0–3.0)
Eosinophils Absolute: 0.1 10*3/uL (ref 0.0–0.7)
Eosinophils Relative: 0.9 % (ref 0.0–5.0)
HCT: 40.7 % (ref 36.0–46.0)
Hemoglobin: 14.1 g/dL (ref 12.0–15.0)
LYMPHS ABS: 2.2 10*3/uL (ref 0.7–4.0)
Lymphocytes Relative: 39.3 % (ref 12.0–46.0)
MCHC: 34.5 g/dL (ref 30.0–36.0)
MCV: 90.3 fl (ref 78.0–100.0)
MONOS PCT: 8.8 % (ref 3.0–12.0)
Monocytes Absolute: 0.5 10*3/uL (ref 0.1–1.0)
NEUTROS PCT: 50.7 % (ref 43.0–77.0)
Neutro Abs: 2.8 10*3/uL (ref 1.4–7.7)
PLATELETS: 257 10*3/uL (ref 150.0–400.0)
RBC: 4.51 Mil/uL (ref 3.87–5.11)
RDW: 13.1 % (ref 11.5–15.5)
WBC: 5.5 10*3/uL (ref 4.0–10.5)

## 2015-01-01 LAB — URINALYSIS
BILIRUBIN URINE: NEGATIVE
HGB URINE DIPSTICK: NEGATIVE
KETONES UR: NEGATIVE
LEUKOCYTES UA: NEGATIVE
Nitrite: NEGATIVE
Specific Gravity, Urine: 1.015 (ref 1.000–1.030)
Total Protein, Urine: NEGATIVE
URINE GLUCOSE: NEGATIVE
UROBILINOGEN UA: 0.2 (ref 0.0–1.0)
pH: 8 (ref 5.0–8.0)

## 2015-01-01 LAB — HEPATIC FUNCTION PANEL
ALBUMIN: 4.4 g/dL (ref 3.5–5.2)
ALT: 12 U/L (ref 0–35)
AST: 16 U/L (ref 0–37)
Alkaline Phosphatase: 52 U/L (ref 39–117)
BILIRUBIN DIRECT: 0.1 mg/dL (ref 0.0–0.3)
TOTAL PROTEIN: 7.2 g/dL (ref 6.0–8.3)
Total Bilirubin: 0.6 mg/dL (ref 0.2–1.2)

## 2015-01-01 LAB — BASIC METABOLIC PANEL
BUN: 17 mg/dL (ref 6–23)
CALCIUM: 9.8 mg/dL (ref 8.4–10.5)
CHLORIDE: 100 meq/L (ref 96–112)
CO2: 29 meq/L (ref 19–32)
CREATININE: 0.88 mg/dL (ref 0.40–1.20)
GFR: 71.22 mL/min (ref >60.00)
Glucose, Bld: 77 mg/dL (ref 70–99)
Potassium: 4.5 mEq/L (ref 3.5–5.1)
Sodium: 135 mEq/L (ref 135–145)

## 2015-01-01 LAB — TSH: TSH: 2.54 u[IU]/mL (ref 0.35–4.50)

## 2015-01-01 LAB — SEDIMENTATION RATE: SED RATE: 12 mm/h (ref 0–22)

## 2015-01-01 MED ORDER — MELOXICAM 15 MG PO TABS: 15.0000 mg | ORAL_TABLET | Freq: Every day | ORAL | Status: DC | PRN

## 2015-01-01 NOTE — Assessment & Plan Note (Signed)
4/16 L - ?[facility] Labs US if not better Meloxicam qd

## 2015-01-01 NOTE — Progress Notes (Signed)
Pre visit review using our clinic review tool, if applicable. No additional management support is needed unless otherwise documented below in the visit note. 

## 2015-01-01 NOTE — Progress Notes (Signed)
Subjective:    HPI  C/o L flank pain worse w/ROM - off and on, severe at times w/ROM. Pt had this pain x10 d. Pt abd distention once. No chills, fever. No pain at rest. No UTI sx's  No A fib The patient needs to address  Chronic hypothyroidism, depression  Wt Readings from Last 3 Encounters:  01/01/15 152 lb 8 oz (69.174 kg)  02/15/14 144 lb (65.318 kg)  07/16/13 154 lb (69.854 kg)   BP Readings from Last 3 Encounters:  01/01/15 126/90  02/15/14 112/80  07/16/13 108/68    BP 126/90 mmHg  Pulse 80  Temp(Src) 97.9 F (36.6 C) (Oral)  Wt 152 lb 8 oz (69.174 kg)  SpO2 97%   Review of Systems  Constitutional: Negative for fever, chills, diaphoresis, activity change, appetite change, fatigue and unexpected weight change.  HENT: Negative for congestion, dental problem, ear pain, hearing loss, mouth sores, postnasal drip, sinus pressure, sneezing, sore throat and voice change.   Eyes: Negative for pain and visual disturbance.  Respiratory: Negative for cough, chest tightness, wheezing and stridor.   Cardiovascular: Negative for chest pain, palpitations and leg swelling.  Gastrointestinal: Negative for nausea, vomiting, abdominal pain, blood in stool, abdominal distention and rectal pain.  Genitourinary: Negative for dysuria, hematuria, decreased urine volume, vaginal bleeding, vaginal discharge, difficulty urinating, vaginal pain and menstrual problem.  Musculoskeletal: Negative for back pain, joint swelling, gait problem and neck pain.  Skin: Negative for color change, rash and wound.  Neurological: Negative for dizziness, tremors, syncope, speech difficulty and light-headedness.  Hematological: Negative for adenopathy.  Psychiatric/Behavioral: Negative for suicidal ideas, hallucinations, behavioral problems, confusion, sleep disturbance, dysphoric mood and decreased concentration. The patient is not hyperactive.        Objective:   Physical Exam  Constitutional: She  appears well-developed. No distress.  HENT:  Head: Normocephalic.  Right Ear: External ear normal.  Left Ear: External ear normal.  Nose: Nose normal.  Mouth/Throat: Oropharynx is clear and moist.  Eyes: Conjunctivae are normal. Pupils are equal, round, and reactive to light. Right eye exhibits no discharge. Left eye exhibits no discharge.  Neck: Normal range of motion. Neck supple. No JVD present. No tracheal deviation present. No thyromegaly present.  Cardiovascular: Normal rate, regular rhythm and normal heart sounds.   Pulmonary/Chest: No stridor. No respiratory distress. She has no wheezes.  Abdominal: Soft. Bowel sounds are normal. She exhibits no distension and no mass. There is no tenderness. There is no rebound and no guarding.  Musculoskeletal: She exhibits no edema or tenderness.  Lymphadenopathy:    She has no cervical adenopathy.  Neurological: She displays normal reflexes. No cranial nerve deficit. She exhibits normal muscle tone. Coordination normal.  Skin: No rash noted. No erythema.  Psychiatric: She has a normal mood and affect. Her behavior is normal. Judgment and thought content normal.  LS NT No rash Str leg elev (-) B  Lab Results  Component Value Date   WBC 6.8 03/14/2014   HGB 13.5 03/14/2014   HCT 40.3 03/14/2014   PLT 312.0 03/14/2014   GLUCOSE 94 03/14/2014   CHOL 225* 03/14/2014   TRIG 72.0 03/14/2014   HDL 76.20 03/14/2014   LDLDIRECT 123.6 02/14/2013   LDLCALC 134* 03/14/2014   ALT 13 03/14/2014   AST 20 03/14/2014   NA 140 03/14/2014   K 4.3 03/14/2014   CL 104 03/14/2014   CREATININE 0.8 03/14/2014   BUN 13 03/14/2014   CO2 31 03/14/2014  TSH 1.72 03/14/2014         Assessment & Plan:

## 2015-01-01 NOTE — Patient Instructions (Signed)
Call if not well 

## 2015-01-01 NOTE — Assessment & Plan Note (Signed)
On Cymbalta 

## 2015-01-01 NOTE — Assessment & Plan Note (Signed)
Labs Levothroid 

## 2015-01-17 ENCOUNTER — Other Ambulatory Visit: Payer: Self-pay | Admitting: General Surgery

## 2015-03-08 ENCOUNTER — Other Ambulatory Visit: Payer: Self-pay | Admitting: Internal Medicine

## 2015-05-01 ENCOUNTER — Encounter: Payer: Self-pay | Admitting: Gastroenterology

## 2015-06-19 ENCOUNTER — Other Ambulatory Visit (INDEPENDENT_AMBULATORY_CARE_PROVIDER_SITE_OTHER): Payer: 59

## 2015-06-19 ENCOUNTER — Encounter: Payer: Self-pay | Admitting: Internal Medicine

## 2015-06-19 ENCOUNTER — Ambulatory Visit (INDEPENDENT_AMBULATORY_CARE_PROVIDER_SITE_OTHER): Payer: 59 | Admitting: Internal Medicine

## 2015-06-19 VITALS — BP 110/80 | HR 86 | Ht 64.0 in | Wt 147.0 lb

## 2015-06-19 DIAGNOSIS — Z0000 Encounter for general adult medical examination without abnormal findings: Secondary | ICD-10-CM

## 2015-06-19 DIAGNOSIS — Z23 Encounter for immunization: Secondary | ICD-10-CM | POA: Diagnosis not present

## 2015-06-19 DIAGNOSIS — E669 Obesity, unspecified: Secondary | ICD-10-CM

## 2015-06-19 DIAGNOSIS — E038 Other specified hypothyroidism: Secondary | ICD-10-CM

## 2015-06-19 DIAGNOSIS — I480 Paroxysmal atrial fibrillation: Secondary | ICD-10-CM | POA: Diagnosis not present

## 2015-06-19 DIAGNOSIS — L723 Sebaceous cyst: Secondary | ICD-10-CM | POA: Diagnosis not present

## 2015-06-19 DIAGNOSIS — E034 Atrophy of thyroid (acquired): Secondary | ICD-10-CM | POA: Diagnosis not present

## 2015-06-19 LAB — URINALYSIS, ROUTINE W REFLEX MICROSCOPIC
Bilirubin Urine: NEGATIVE
Hgb urine dipstick: NEGATIVE
Ketones, ur: NEGATIVE
Nitrite: NEGATIVE
PH: 8 (ref 5.0–8.0)
Specific Gravity, Urine: 1.015 (ref 1.000–1.030)
TOTAL PROTEIN, URINE-UPE24: NEGATIVE
URINE GLUCOSE: NEGATIVE
UROBILINOGEN UA: 0.2 (ref 0.0–1.0)

## 2015-06-19 LAB — LIPID PANEL
CHOL/HDL RATIO: 3
CHOLESTEROL: 204 mg/dL — AB (ref 0–200)
HDL: 70.1 mg/dL (ref >39.00)
LDL CALC: 119 mg/dL — AB (ref 0–99)
NonHDL: 133.76
TRIGLYCERIDES: 73 mg/dL (ref 0.0–149.0)
VLDL: 14.6 mg/dL (ref 0.0–40.0)

## 2015-06-19 LAB — CBC WITH DIFFERENTIAL/PLATELET
Basophils Absolute: 0 K/uL (ref 0.0–0.1)
Basophils Relative: 0.5 % (ref 0.0–3.0)
Eosinophils Absolute: 0.1 K/uL (ref 0.0–0.7)
Eosinophils Relative: 2.1 % (ref 0.0–5.0)
HCT: 42 % (ref 36.0–46.0)
Hemoglobin: 14 g/dL (ref 12.0–15.0)
Lymphocytes Relative: 43.2 % (ref 12.0–46.0)
Lymphs Abs: 2.1 K/uL (ref 0.7–4.0)
MCHC: 33.4 g/dL (ref 30.0–36.0)
MCV: 93.4 fl (ref 78.0–100.0)
Monocytes Absolute: 0.4 K/uL (ref 0.1–1.0)
Monocytes Relative: 7.5 % (ref 3.0–12.0)
Neutro Abs: 2.3 K/uL (ref 1.4–7.7)
Neutrophils Relative %: 46.7 % (ref 43.0–77.0)
Platelets: 244 K/uL (ref 150.0–400.0)
RBC: 4.5 Mil/uL (ref 3.87–5.11)
RDW: 13.3 % (ref 11.5–15.5)
WBC: 4.8 K/uL (ref 4.0–10.5)

## 2015-06-19 LAB — HEPATIC FUNCTION PANEL
ALT: 15 U/L (ref 0–35)
AST: 17 U/L (ref 0–37)
Albumin: 4.3 g/dL (ref 3.5–5.2)
Alkaline Phosphatase: 45 U/L (ref 39–117)
Bilirubin, Direct: 0.1 mg/dL (ref 0.0–0.3)
Total Bilirubin: 0.6 mg/dL (ref 0.2–1.2)
Total Protein: 7.1 g/dL (ref 6.0–8.3)

## 2015-06-19 LAB — BASIC METABOLIC PANEL WITH GFR
BUN: 18 mg/dL (ref 6–23)
CO2: 29 meq/L (ref 19–32)
Calcium: 9.4 mg/dL (ref 8.4–10.5)
Chloride: 104 meq/L (ref 96–112)
Creatinine, Ser: 0.85 mg/dL (ref 0.40–1.20)
GFR: 74 mL/min
Glucose, Bld: 83 mg/dL (ref 70–99)
Potassium: 4.1 meq/L (ref 3.5–5.1)
Sodium: 139 meq/L (ref 135–145)

## 2015-06-19 LAB — TSH: TSH: 2.2 u[IU]/mL (ref 0.35–4.50)

## 2015-06-19 MED ORDER — LEVOTHYROXINE SODIUM 75 MCG PO TABS
75.0000 ug | ORAL_TABLET | Freq: Every day | ORAL | Status: DC
Start: 2015-06-19 — End: 2016-09-02

## 2015-06-19 NOTE — Progress Notes (Signed)
Subjective:  Patient ID: Barbara Costa, female    DOB: January 23, 1961  Age: 54 y.o. MRN: [id number]  CC: Annual Exam   HPI Barbara Costa presents for a well exam C/o a bump on the head  Outpatient Prescriptions Prior to Visit  Medication Sig Dispense Refill  . calcium carbonate (CALCIUM 600) 600 MG TABS Take 600 mg by mouth daily.    . clonazePAM (KLONOPIN) 0.5 MG tablet Take 0.5 mg by mouth daily.    . DULoxetine (CYMBALTA) 30 MG capsule Take 90 mg by mouth daily.    . Multiple Vitamins-Minerals ([employer] SPECTRAVITE ADULT 50+ PO) Take by mouth daily.    . Cholecalciferol 1000 UNITS tablet Take 2,000 Units by mouth daily.     Marland Kitchen levothyroxine (SYNTHROID, LEVOTHROID) 75 MCG tablet TAKE 1 TABLET BY MOUTH DAILY 90 tablet 2  . meloxicam (MOBIC) 15 MG tablet Take 1 tablet (15 mg total) by mouth daily as needed for pain (take daily x 1 week, then prn pain). (Patient not taking: Reported on 06/19/2015) 30 tablet 0   No facility-administered medications prior to visit.    ROS Review of Systems  Constitutional: Negative for chills, activity change, appetite change, fatigue and unexpected weight change.  HENT: Negative for congestion, mouth sores and sinus pressure.   Eyes: Negative for visual disturbance.  Respiratory: Negative for cough and chest tightness.   Gastrointestinal: Negative for nausea and abdominal pain.  Genitourinary: Negative for frequency, difficulty urinating and vaginal pain.  Musculoskeletal: Negative for back pain and gait problem.  Skin: Negative for pallor and rash.  Neurological: Negative for dizziness, tremors, weakness, numbness and headaches.  Psychiatric/Behavioral: Negative for confusion and sleep disturbance.    Objective:  BP 110/80 mmHg  Pulse 86  Ht  (1.626 m)  Wt 147 lb (66.679 kg)  BMI 25.22 kg/m2  SpO2 97%  BP Readings from Last 3 Encounters:  06/19/15 110/80  01/01/15 126/90  02/15/14 112/80    Wt Readings from Last 3 Encounters:  06/19/15  147 lb (66.679 kg)  01/01/15 152 lb 8 oz (69.174 kg)  02/15/14 144 lb (65.318 kg)    Physical Exam  Constitutional: She appears well-developed. No distress.  HENT:  Head: Normocephalic.  Right Ear: External ear normal.  Left Ear: External ear normal.  Nose: Nose normal.  Mouth/Throat: Oropharynx is clear and moist.  Eyes: Conjunctivae are normal. Pupils are equal, round, and reactive to light. Right eye exhibits no discharge. Left eye exhibits no discharge.  Neck: Normal range of motion. Neck supple. No JVD present. No tracheal deviation present. No thyromegaly present.  Cardiovascular: Normal rate, regular rhythm and normal heart sounds.   Pulmonary/Chest: No stridor. No respiratory distress. She has no wheezes.  Abdominal: Soft. Bowel sounds are normal. She exhibits no distension and no mass. There is no tenderness. There is no rebound and no guarding.  Musculoskeletal: She exhibits no edema or tenderness.  Lymphadenopathy:    She has no cervical adenopathy.  Neurological: She displays normal reflexes. No cranial nerve deficit. She exhibits normal muscle tone. Coordination normal.  Skin: No rash noted. No erythema.  Psychiatric: She has a normal mood and affect. Her behavior is normal. Judgment and thought content normal.  seb cyst on scalp  Lab Results  Component Value Date   WBC 5.5 01/01/2015   HGB 14.1 01/01/2015   HCT 40.7 01/01/2015   PLT 257.0 01/01/2015   GLUCOSE 77 01/01/2015   CHOL 225* 03/14/2014   TRIG 72.0 03/14/2014  HDL 76.20 03/14/2014   LDLDIRECT 123.6 02/14/2013   LDLCALC 134* 03/14/2014   ALT 12 01/01/2015   AST 16 01/01/2015   NA 135 01/01/2015   K 4.5 01/01/2015   CL 100 01/01/2015   CREATININE 0.88 01/01/2015   BUN 17 01/01/2015   CO2 29 01/01/2015   TSH 2.54 01/01/2015    Dg Chest Port 1 View  07/16/2013   CLINICAL DATA:  Tachycardia during colonoscopy  EXAM: PORTABLE CHEST - 1 VIEW  COMPARISON:  None.  FINDINGS: The heart size and  mediastinal contours are within normal limits. Both lungs are clear. The visualized skeletal structures are unremarkable.  IMPRESSION: No active disease.   Electronically Signed   By: Christiana Pellant M.D.   On: 07/16/2013 14:44    Assessment & Plan:   Barbara Costa was seen today for annual exam.  Diagnoses and all orders for this visit:  Well adult exam -     levothyroxine (SYNTHROID, LEVOTHROID) 75 MCG tablet; Take 1 tablet (75 mcg total) by mouth daily. -     Basic metabolic panel; Future -     CBC with Differential/Platelet; Future -     Hepatic function panel; Future -     Lipid panel; Future -     TSH; Future -     Urinalysis; Future  Need for influenza vaccination -     Flu Vaccine QUAD 36+ mos IM  Paroxysmal atrial fibrillation  Sebaceous cyst  Hypothyroidism due to acquired atrophy of thyroid  Obesity  Other orders -     Cancel: levothyroxine (SYNTHROID, LEVOTHROID) 75 MCG tablet; Take 1 tablet (75 mcg total) by mouth daily.   I have discontinued Ms. Domagalski's meloxicam. I have also changed her levothyroxine. Additionally, I am having her maintain her DULoxetine, clonazePAM, calcium carbonate, Multiple Vitamins-Minerals ([employer] SPECTRAVITE ADULT 50+ PO), and Cholecalciferol.  Meds ordered this encounter  Medications  . Cholecalciferol 2000 UNITS CAPS    Sig: Take 1 capsule by mouth daily.  Marland Kitchen levothyroxine (SYNTHROID, LEVOTHROID) 75 MCG tablet    Sig: Take 1 tablet (75 mcg total) by mouth daily.    Dispense:  90 tablet    Refill:  3     Follow-up: Return in about 1 year (around 06/18/2016) for Wellness Exam.  Sonda Primes, MD

## 2015-06-19 NOTE — Patient Instructions (Signed)
Epidermal Cyst An epidermal cyst is sometimes called a sebaceous cyst, epidermal inclusion cyst, or infundibular cyst. These cysts usually contain a substance that looks "pasty" or "cheesy" and may have a bad smell. This substance is a protein called keratin. Epidermal cysts are usually found on the face, neck, or trunk. They may also occur in the vaginal area or other parts of the genitalia of both men and women. Epidermal cysts are usually small, painless, slow-growing bumps or lumps that move freely under the skin. It is important not to try to pop them. This may cause an infection and lead to tenderness and swelling. CAUSES  Epidermal cysts may be caused by a deep penetrating injury to the skin or a plugged hair follicle, often associated with acne. SYMPTOMS  Epidermal cysts can become inflamed and cause:  Redness.  Tenderness.  Increased temperature of the skin over the bumps or lumps.  Grayish-white, bad smelling material that drains from the bump or lump. DIAGNOSIS  Epidermal cysts are easily diagnosed by your caregiver during an exam. Rarely, a tissue sample (biopsy) may be taken to rule out other conditions that may resemble epidermal cysts. TREATMENT   Epidermal cysts often get better and disappear on their own. They are rarely ever cancerous.  If a cyst becomes infected, it may become inflamed and tender. This may require opening and draining the cyst. Treatment with antibiotics may be necessary. When the infection is gone, the cyst may be removed with minor surgery.  Small, inflamed cysts can often be treated with antibiotics or by injecting steroid medicines.  Sometimes, epidermal cysts become large and bothersome. If this happens, surgical removal in your caregiver's office may be necessary. HOME CARE INSTRUCTIONS  Only take over-the-counter or prescription medicines as directed by your caregiver.  Take your antibiotics as directed. Finish them even if you start to feel  better. SEEK MEDICAL CARE IF:   Your cyst becomes tender, red, or swollen.  Your condition is not improving or is getting worse.  You have any other questions or concerns. MAKE SURE YOU:  Understand these instructions.  Will watch your condition.  Will get help right away if you are not doing well or get worse. Document Released: 08/14/2004 Document Revised: 12/06/2011 Document Reviewed: 03/22/2011 Exit[employer] Patient Information 2015 [employer]. This information is not intended to replace advice given to you by your health care provider. Make sure you discuss any questions you have with your health care provider.  

## 2015-06-19 NOTE — Assessment & Plan Note (Signed)
TSH Levothroid 

## 2015-06-19 NOTE — Progress Notes (Signed)
Pre visit review using our clinic review tool, if applicable. No additional management support is needed unless otherwise documented below in the visit note. 

## 2015-06-19 NOTE — Assessment & Plan Note (Signed)
We discussed age appropriate health related issues, including available/recomended screening tests and vaccinations. We discussed a need for adhering to healthy diet and exercise. Labs/EKG were reviewed/ordered. All questions were answered. Zostavax - will get at 68

## 2015-06-19 NOTE — Assessment & Plan Note (Signed)
Doing well on wt watchers

## 2015-06-19 NOTE — Assessment & Plan Note (Signed)
Removal discussed - scalp

## 2015-06-19 NOTE — Assessment & Plan Note (Signed)
No relapse 

## 2015-06-23 ENCOUNTER — Encounter: Payer: Self-pay | Admitting: Internal Medicine

## 2015-06-23 ENCOUNTER — Other Ambulatory Visit: Payer: Self-pay | Admitting: Internal Medicine

## 2015-06-23 ENCOUNTER — Ambulatory Visit (INDEPENDENT_AMBULATORY_CARE_PROVIDER_SITE_OTHER): Payer: 59 | Admitting: Internal Medicine

## 2015-06-23 VITALS — BP 116/80 | HR 67 | Wt 150.0 lb

## 2015-06-23 DIAGNOSIS — L723 Sebaceous cyst: Secondary | ICD-10-CM

## 2015-06-23 NOTE — Progress Notes (Signed)
Pre visit review using our clinic review tool, if applicable. No additional management support is needed unless otherwise documented below in the visit note. 

## 2015-06-23 NOTE — Progress Notes (Signed)
Subjective:  Patient ID: Barbara Costa, female    DOB: 11/24/1960  Age: 54 y.o. MRN: [id number]  CC: No chief complaint on file.   HPI [name] presents for scalp cyst removal  Outpatient Prescriptions Prior to Visit  Medication Sig Dispense Refill  . calcium carbonate (CALCIUM 600) 600 MG TABS Take 600 mg by mouth daily.    . Cholecalciferol 2000 UNITS CAPS Take 1 capsule by mouth daily.    . clonazePAM (KLONOPIN) 0.5 MG tablet Take 0.5 mg by mouth daily.    . DULoxetine (CYMBALTA) 30 MG capsule Take 90 mg by mouth daily.    Marland Kitchen levothyroxine (SYNTHROID, LEVOTHROID) 75 MCG tablet Take 1 tablet (75 mcg total) by mouth daily. 90 tablet 3  . Multiple Vitamins-Minerals ([employer] SPECTRAVITE ADULT 50+ PO) Take by mouth daily.     No facility-administered medications prior to visit.    ROS Review of Systems  Constitutional: Negative for fever.  Skin: Negative for rash.    Objective:  BP 116/80 mmHg  Pulse 67  Wt 150 lb (68.04 kg)  SpO2 96%  BP Readings from Last 3 Encounters:  06/23/15 116/80  06/19/15 110/80  01/01/15 126/90    Wt Readings from Last 3 Encounters:  06/23/15 150 lb (68.04 kg)  06/19/15 147 lb (66.679 kg)  01/01/15 152 lb 8 oz (69.174 kg)    Physical Exam  Constitutional: She appears well-developed. No distress.  HENT:  Head: Normocephalic.  Right Ear: External ear normal.  Left Ear: External ear normal.  Nose: Nose normal.  Mouth/Throat: Oropharynx is clear and moist.  Neck: Normal range of motion.  Abdominal: Soft. Bowel sounds are normal.  Skin: No rash noted. No erythema.  Psychiatric: She has a normal mood and affect. Her behavior is normal.  L posterior coronal location sq cyst  Lab Results  Component Value Date   WBC 4.8 06/19/2015   HGB 14.0 06/19/2015   HCT 42.0 06/19/2015   PLT 244.0 06/19/2015   GLUCOSE 83 06/19/2015   CHOL 204* 06/19/2015   TRIG 73.0 06/19/2015   HDL 70.10 06/19/2015   LDLDIRECT 123.6 02/14/2013   LDLCALC  119* 06/19/2015   ALT 15 06/19/2015   AST 17 06/19/2015   NA 139 06/19/2015   K 4.1 06/19/2015   CL 104 06/19/2015   CREATININE 0.85 06/19/2015   BUN 18 06/19/2015   CO2 29 06/19/2015   TSH 2.20 06/19/2015    Dg Chest Port 1 View  07/16/2013   CLINICAL DATA:  Tachycardia during colonoscopy  EXAM: PORTABLE CHEST - 1 VIEW  COMPARISON:  None.  FINDINGS: The heart size and mediastinal contours are within normal limits. Both lungs are clear. The visualized skeletal structures are unremarkable.  IMPRESSION: No active disease.   Electronically Signed   By: Christiana Pellant M.D.   On: 07/16/2013 14:44   Procedure: Lesion #1 on scalp  measuring 11x10 mm sq. Consent obtained.   Skin over lesion #1  was prepped with Betadine and alcohol  and anesthetized with 2 cc of 2% lidocaine and epinephrine, using a 25-gauge 1 inch needle. Excisional biopsy with a sterile #11 blade was carried out in the usual fashion. White cyst was excised. Wound was irrigated. Then it was closed with 3 staples. Band-Aid was applied with antibiotic ointment.    Tolerated well. Complications none.   Assessment & Plan:   Diagnoses and all orders for this visit:  Sebaceous cyst -     Dermatology pathology   I  am having Ms. Sturgell maintain her DULoxetine, clonazePAM, calcium carbonate, Multiple Vitamins-Minerals ([employer] SPECTRAVITE ADULT 50+ PO), Cholecalciferol, and levothyroxine.  No orders of the defined types were placed in this encounter.     Follow-up: Return in about 10 years (around 06/22/2025).  Sonda Primes, MD

## 2015-06-23 NOTE — Addendum Note (Signed)
Addended by: Tresa Garter on: 06/23/2015 10:17 AM   Modules accepted: Kipp Brood

## 2015-07-03 ENCOUNTER — Encounter: Payer: Self-pay | Admitting: Internal Medicine

## 2015-07-03 ENCOUNTER — Ambulatory Visit (INDEPENDENT_AMBULATORY_CARE_PROVIDER_SITE_OTHER): Payer: 59 | Admitting: Internal Medicine

## 2015-07-03 VITALS — BP 120/90 | HR 66 | Temp 98.8°F | Wt 150.0 lb

## 2015-07-03 DIAGNOSIS — L723 Sebaceous cyst: Secondary | ICD-10-CM

## 2015-07-03 NOTE — Progress Notes (Signed)
3 taples were removed from the scalp wound

## 2015-07-03 NOTE — Progress Notes (Signed)
Pre visit review using our clinic review tool, if applicable. No additional management support is needed unless otherwise documented below in the visit note. 

## 2015-07-03 NOTE — Assessment & Plan Note (Signed)
Staples removed 

## 2015-09-27 ENCOUNTER — Ambulatory Visit (INDEPENDENT_AMBULATORY_CARE_PROVIDER_SITE_OTHER): Payer: 59 | Admitting: Internal Medicine

## 2015-09-27 ENCOUNTER — Encounter: Payer: Self-pay | Admitting: Internal Medicine

## 2015-09-27 VITALS — BP 128/74 | Temp 98.2°F | Ht 64.0 in | Wt 156.0 lb

## 2015-09-27 DIAGNOSIS — H6983 Other specified disorders of Eustachian tube, bilateral: Secondary | ICD-10-CM | POA: Diagnosis not present

## 2015-09-27 MED ORDER — PREDNISONE 10 MG PO TABS: ORAL_TABLET | ORAL | Status: DC

## 2015-09-27 NOTE — Patient Instructions (Signed)

## 2015-09-27 NOTE — Progress Notes (Signed)
Pre visit review using our clinic review tool, if applicable. No additional management support is needed unless otherwise documented below in the visit note. 

## 2015-09-27 NOTE — Progress Notes (Signed)
Subjective:    Patient ID: Barbara Costa, female    DOB: 12/15/1960, 54 y.o.   MRN: [id number]  HPI  Pt presents to the clinic today with c/o bilateral ear fullness. She reports this started 1 week ago. It started out as a cold, with a runny nose, sore throat and cough. She reports those symptoms have resolved but she has this continued ear fullness. She does have decreased hearing. She denies ear pain or fever. She has tried Sudafed and hydrogen peroxide drops with minimal relief. She does not have an issue with wax buildup. She has no history of seasonal allergies. She has not had sick contacts that she is aware of.  Review of Systems      Past Medical History  Diagnosis Date  . Anxiety   . Depression   . Hyperlipidemia     no meds per pt  . Thyroid disease     Current Outpatient Prescriptions  Medication Sig Dispense Refill  . calcium carbonate (CALCIUM 600) 600 MG TABS Take 600 mg by mouth daily.    . Cholecalciferol 2000 UNITS CAPS Take 1 capsule by mouth daily.    . clonazePAM (KLONOPIN) 0.5 MG tablet Take 0.5 mg by mouth daily.    . DULoxetine (CYMBALTA) 30 MG capsule Take 90 mg by mouth daily.    Marland Kitchen. levothyroxine (SYNTHROID, LEVOTHROID) 75 MCG tablet Take 1 tablet (75 mcg total) by mouth daily. 90 tablet 3  . Multiple Vitamins-Minerals ([employer] SPECTRAVITE ADULT 50+ PO) Take by mouth daily.     No current facility-administered medications for this visit.    No Known Allergies  Family History  Problem Relation Age of Onset  . Colon polyps Father   . Lung cancer Brother   . Colon cancer Maternal Grandfather   . Esophageal cancer Neg Hx   . Rectal cancer Neg Hx   . Stomach cancer Neg Hx     Social History   Social History  . Marital Status: Single    Spouse Name: N/A  . Number of Children: N/A  . Years of Education: N/A   Occupational History  . ACCOUNTANT    Social History Main Topics  . Smoking status: Never Smoker   . Smokeless tobacco: Never Used  .  Alcohol Use: 0.5 - 1.0 oz/week    1-2 drink(s) per week  . Drug Use: No  . Sexual Activity: Not on file   Other Topics Concern  . Not on file   Social History Narrative   Lives alone, eats healthy, exercises regularly with walking.     Constitutional: Denies fever, malaise, fatigue, headache or abrupt weight changes.  HEENT: Pt report bilateral ear fullness. Denies eye pain, eye redness, ear pain, ringing in the ears, wax buildup, runny nose, nasal congestion, bloody nose, or sore throat. Respiratory: Denies difficulty breathing, shortness of breath, cough or sputum production.   Cardiovascular: Denies chest pain, chest tightness, palpitations or swelling in the hands or feet.  Neurological: Denies dizziness, difficulty with memory, difficulty with speech or problems with balance and coordination.   No other specific complaints in a complete review of systems (except as listed in HPI above).  Objective:   Physical Exam  BP 128/74 mmHg  Temp(Src) 98.2 F (36.8 C) (Oral)  Ht 5\' 4"  (1.626 m)  Wt 156 lb (70.761 kg)  BMI 26.76 kg/m2 Wt Readings from Last 3 Encounters:  09/27/15 156 lb (70.761 kg)  07/03/15 150 lb (68.04 kg)  06/23/15 150 lb (68.04  kg)    General: Appears her stated age, well developed, well nourished in NAD. HEENT: Head: normal shape and size, no sinus tenderness noted; Eyes: sclera white, no icterus, conjunctiva pink, PERRLA and EOMs intact; Ears: Tm's pink but intact, normal light reflex, + serous effusion bilaterally; Nose: mucosa pink and moist, septum midline; Throat/Mouth: Teeth present, mucosa pink and moist, no exudate, lesions or ulcerations noted.  Neck:  No cervical adenopathy noted.  Cardiovascular: Normal rate and rhythm. S1,S2 noted.  No murmur, rubs or gallops noted.  Pulmonary/Chest: Normal effort and positive vesicular breath sounds. No respiratory distress. No wheezes, rales or ronchi noted.    BMET    Component Value Date/Time   NA 139  06/19/2015 0902   K 4.1 06/19/2015 0902   CL 104 06/19/2015 0902   CO2 29 06/19/2015 0902   GLUCOSE 83 06/19/2015 0902   BUN 18 06/19/2015 0902   CREATININE 0.85 06/19/2015 0902   CALCIUM 9.4 06/19/2015 0902   GFRNONAA 70.77 01/14/2010 0812   GFRAA 87 02/05/2008 0755    Lipid Panel     Component Value Date/Time   CHOL 204* 06/19/2015 0902   TRIG 73.0 06/19/2015 0902   HDL 70.10 06/19/2015 0902   CHOLHDL 3 06/19/2015 0902   VLDL 14.6 06/19/2015 0902   LDLCALC 119* 06/19/2015 0902    CBC    Component Value Date/Time   WBC 4.8 06/19/2015 0902   RBC 4.50 06/19/2015 0902   HGB 14.0 06/19/2015 0902   HCT 42.0 06/19/2015 0902   PLT 244.0 06/19/2015 0902   MCV 93.4 06/19/2015 0902   MCH 30.5 07/16/2013 1545   MCHC 33.4 06/19/2015 0902   RDW 13.3 06/19/2015 0902   LYMPHSABS 2.1 06/19/2015 0902   MONOABS 0.4 06/19/2015 0902   EOSABS 0.1 06/19/2015 0902   BASOSABS 0.0 06/19/2015 0902    Hgb A1C No results found for: HGBA1C       Assessment & Plan:   ETD, bilateral:  She has failed Sudafed Try Flonase 1 spray each nostril BID x 1 week Printed RX given for Pred Taper to start in 1 week if no improvement in symptoms  RTC as needed or if symptoms persist or worsen

## 2016-08-08 ENCOUNTER — Other Ambulatory Visit: Payer: Self-pay | Admitting: Internal Medicine

## 2016-08-08 DIAGNOSIS — Z0000 Encounter for general adult medical examination without abnormal findings: Secondary | ICD-10-CM

## 2016-09-02 ENCOUNTER — Ambulatory Visit (INDEPENDENT_AMBULATORY_CARE_PROVIDER_SITE_OTHER): Payer: 59 | Admitting: Internal Medicine

## 2016-09-02 ENCOUNTER — Encounter: Payer: Self-pay | Admitting: Internal Medicine

## 2016-09-02 DIAGNOSIS — Z23 Encounter for immunization: Secondary | ICD-10-CM | POA: Diagnosis not present

## 2016-09-02 DIAGNOSIS — Z0000 Encounter for general adult medical examination without abnormal findings: Secondary | ICD-10-CM | POA: Diagnosis not present

## 2016-09-02 MED ORDER — LEVOTHYROXINE SODIUM 75 MCG PO TABS: 75.0000 ug | ORAL_TABLET | Freq: Every day | ORAL | 3 refills | Status: DC

## 2016-09-02 NOTE — Progress Notes (Signed)
Pre visit review using our clinic review tool, if applicable. No additional management support is needed unless otherwise documented below in the visit note. 

## 2016-09-02 NOTE — Progress Notes (Signed)
Subjective:  Patient ID: Barbara Costa, female    DOB: 02/06/1961  Age: 55 y.o. MRN: [id number]  CC: Annual Exam   HPI Barbara Costa presents for a well exam  Outpatient Medications Prior to Visit  Medication Sig Dispense Refill  . calcium carbonate (CALCIUM 600) 600 MG TABS Take 600 mg by mouth daily.    . clonazePAM (KLONOPIN) 0.5 MG tablet Take 0.5 mg by mouth daily.    . DULoxetine (CYMBALTA) 30 MG capsule Take 90 mg by mouth daily.    Marland Kitchen. levothyroxine (SYNTHROID, LEVOTHROID) 75 MCG tablet Take 1 tablet (75 mcg total) by mouth daily. 90 tablet 3  . Multiple Vitamins-Minerals ([employer] SPECTRAVITE ADULT 50+ PO) Take by mouth daily.    . Cholecalciferol 2000 UNITS CAPS Take 1 capsule by mouth daily.    . predniSONE (DELTASONE) 10 MG tablet Take 6 tabs day 1, 5 tabs day 2, 4 tabs day 3, 3 tabs day 4, 2 tabs day 5, 1 tab day 6 21 tablet 0   No facility-administered medications prior to visit.     ROS Review of Systems  Constitutional: Negative for activity change, appetite change, chills, fatigue and unexpected weight change.  HENT: Negative for congestion, mouth sores and sinus pressure.   Eyes: Negative for visual disturbance.  Respiratory: Negative for cough and chest tightness.   Gastrointestinal: Negative for abdominal pain, diarrhea and nausea.  Genitourinary: Negative for difficulty urinating, frequency and vaginal pain.  Musculoskeletal: Negative for back pain and gait problem.  Skin: Negative for pallor and rash.  Neurological: Negative for dizziness, tremors, weakness, numbness and headaches.  Psychiatric/Behavioral: Negative for confusion and sleep disturbance.    Objective:  BP 114/70   Pulse 78   Temp 98.6 F (37 C) (Oral)   Ht 5\' 4"  (1.626 m)   Wt 162 lb (73.5 kg)   SpO2 92%   BMI 27.81 kg/m   BP Readings from Last 3 Encounters:  09/02/16 114/70  09/27/15 128/74  07/03/15 120/90    Wt Readings from Last 3 Encounters:  09/02/16 162 lb (73.5 kg)    09/27/15 156 lb (70.8 kg)  07/03/15 150 lb (68 kg)    Physical Exam  Constitutional: She appears well-developed. No distress.  HENT:  Head: Normocephalic.  Right Ear: External ear normal.  Left Ear: External ear normal.  Nose: Nose normal.  Mouth/Throat: Oropharynx is clear and moist.  Eyes: Conjunctivae are normal. Pupils are equal, round, and reactive to light. Right eye exhibits no discharge. Left eye exhibits no discharge.  Neck: Normal range of motion. Neck supple. No JVD present. No tracheal deviation present. No thyromegaly present.  Cardiovascular: Normal rate, regular rhythm and normal heart sounds.   Pulmonary/Chest: No stridor. No respiratory distress. She has no wheezes.  Abdominal: Soft. Bowel sounds are normal. She exhibits no distension and no mass. There is no tenderness. There is no rebound and no guarding.  Musculoskeletal: She exhibits no edema or tenderness.  Lymphadenopathy:    She has no cervical adenopathy.  Neurological: She displays normal reflexes. No cranial nerve deficit. She exhibits normal muscle tone. Coordination normal.  Skin: No rash noted. No erythema.  Psychiatric: She has a normal mood and affect. Her behavior is normal. Judgment and thought content normal.    Lab Results  Component Value Date   WBC 4.8 06/19/2015   HGB 14.0 06/19/2015   HCT 42.0 06/19/2015   PLT 244.0 06/19/2015   GLUCOSE 83 06/19/2015   CHOL 204 (H) 06/19/2015  TRIG 73.0 06/19/2015   HDL 70.10 06/19/2015   LDLDIRECT 123.6 02/14/2013   LDLCALC 119 (H) 06/19/2015   ALT 15 06/19/2015   AST 17 06/19/2015   NA 139 06/19/2015   K 4.1 06/19/2015   CL 104 06/19/2015   CREATININE 0.85 06/19/2015   BUN 18 06/19/2015   CO2 29 06/19/2015   TSH 2.20 06/19/2015    Dg Chest Port 1 View  Result Date: 07/16/2013 CLINICAL DATA:  Tachycardia during colonoscopy EXAM: PORTABLE CHEST - 1 VIEW COMPARISON:  None. FINDINGS: The heart size and mediastinal contours are within normal  limits. Both lungs are clear. The visualized skeletal structures are unremarkable. IMPRESSION: No active disease. Electronically Signed   By: Christiana PellantGretchen  Green M.D.   On: 07/16/2013 14:44    Assessment & Plan:   Britta MccreedyBarbara was seen today for annual exam.  Diagnoses and all orders for this visit:  Well adult exam -     levothyroxine (SYNTHROID, LEVOTHROID) 75 MCG tablet; Take 1 tablet (75 mcg total) by mouth daily.   I have discontinued Barbara Costa's predniSONE. I am also having her maintain her DULoxetine, clonazePAM, calcium carbonate, Multiple Vitamins-Minerals ([employer] SPECTRAVITE ADULT 50+ PO), levothyroxine, Cholecalciferol, and Probiotic Product (PROBIOTIC DAILY PO).  Meds ordered this encounter  Medications  . Cholecalciferol 3000 units TABS    Sig: Take 1 tablet by mouth daily.  . Probiotic Product (PROBIOTIC DAILY PO)    Sig: Take 1 each by mouth daily.     Follow-up: No Follow-up on file.  Sonda PrimesAlex Ulys Favia, MD

## 2016-09-02 NOTE — Addendum Note (Signed)
Addended by: Merrilyn PumaSIMMONS, STACEY N on: 09/02/2016 10:57 AM   Modules accepted: Orders

## 2016-09-02 NOTE — Assessment & Plan Note (Addendum)
We discussed age appropriate health related issues, including available/recomended screening tests and vaccinations. We discussed a need for adhering to healthy diet and exercise. Labs/EKG were reviewed/ordered. All questions were answered.  Zostavax - will get at 60 Colon 2014 GYN, eye exam q 12 mo Labs

## 2016-09-13 ENCOUNTER — Encounter: Payer: Self-pay | Admitting: Internal Medicine

## 2016-09-13 ENCOUNTER — Other Ambulatory Visit (INDEPENDENT_AMBULATORY_CARE_PROVIDER_SITE_OTHER): Payer: 59

## 2016-09-13 DIAGNOSIS — Z0000 Encounter for general adult medical examination without abnormal findings: Secondary | ICD-10-CM | POA: Diagnosis not present

## 2016-09-13 LAB — URINALYSIS
Bilirubin Urine: NEGATIVE
Hgb urine dipstick: NEGATIVE
Ketones, ur: NEGATIVE
Leukocytes, UA: NEGATIVE
Nitrite: NEGATIVE
SPECIFIC GRAVITY, URINE: 1.015 (ref 1.000–1.030)
Total Protein, Urine: NEGATIVE
URINE GLUCOSE: NEGATIVE
UROBILINOGEN UA: 0.2 (ref 0.0–1.0)
pH: 8 (ref 5.0–8.0)

## 2016-09-13 LAB — CBC WITH DIFFERENTIAL/PLATELET
Basophils Absolute: 0 10*3/uL (ref 0.0–0.1)
Basophils Relative: 0.4 % (ref 0.0–3.0)
EOS PCT: 1.4 % (ref 0.0–5.0)
Eosinophils Absolute: 0.1 10*3/uL (ref 0.0–0.7)
HEMATOCRIT: 41.4 % (ref 36.0–46.0)
HEMOGLOBIN: 14.1 g/dL (ref 12.0–15.0)
LYMPHS ABS: 2.9 10*3/uL (ref 0.7–4.0)
LYMPHS PCT: 39.9 % (ref 12.0–46.0)
MCHC: 34.1 g/dL (ref 30.0–36.0)
MCV: 92.3 fl (ref 78.0–100.0)
MONOS PCT: 8.5 % (ref 3.0–12.0)
Monocytes Absolute: 0.6 10*3/uL (ref 0.1–1.0)
NEUTROS PCT: 49.8 % (ref 43.0–77.0)
Neutro Abs: 3.6 10*3/uL (ref 1.4–7.7)
Platelets: 288 10*3/uL (ref 150.0–400.0)
RBC: 4.49 Mil/uL (ref 3.87–5.11)
RDW: 12.9 % (ref 11.5–15.5)
WBC: 7.2 10*3/uL (ref 4.0–10.5)

## 2016-09-13 LAB — LIPID PANEL
CHOL/HDL RATIO: 3
Cholesterol: 229 mg/dL — ABNORMAL HIGH (ref 0–200)
HDL: 76 mg/dL (ref >39.00)
LDL CALC: 139 mg/dL — AB (ref 0–99)
NonHDL: 152.91
TRIGLYCERIDES: 70 mg/dL (ref 0.0–149.0)
VLDL: 14 mg/dL (ref 0.0–40.0)

## 2016-09-13 LAB — HEPATIC FUNCTION PANEL
ALT: 12 U/L (ref 0–35)
AST: 16 U/L (ref 0–37)
Albumin: 4.6 g/dL (ref 3.5–5.2)
Alkaline Phosphatase: 50 U/L (ref 39–117)
BILIRUBIN DIRECT: 0 mg/dL (ref 0.0–0.3)
BILIRUBIN TOTAL: 0.6 mg/dL (ref 0.2–1.2)
Total Protein: 6.9 g/dL (ref 6.0–8.3)

## 2016-09-13 LAB — BASIC METABOLIC PANEL
BUN: 23 mg/dL (ref 6–23)
CALCIUM: 9.3 mg/dL (ref 8.4–10.5)
CO2: 29 mEq/L (ref 19–32)
CREATININE: 0.78 mg/dL (ref 0.40–1.20)
Chloride: 100 mEq/L (ref 96–112)
GFR: 81.34 mL/min (ref >60.00)
Glucose, Bld: 94 mg/dL (ref 70–99)
Potassium: 4.3 mEq/L (ref 3.5–5.1)
Sodium: 137 mEq/L (ref 135–145)

## 2016-09-13 LAB — HEPATITIS C ANTIBODY: HCV AB: NEGATIVE

## 2016-09-13 LAB — TSH: TSH: 1.85 u[IU]/mL (ref 0.35–4.50)

## 2017-08-05 LAB — HM PAP SMEAR: HM Pap smear: NORMAL

## 2017-09-02 ENCOUNTER — Encounter: Payer: 59 | Admitting: Internal Medicine

## 2017-10-18 ENCOUNTER — Encounter: Payer: Self-pay | Admitting: Internal Medicine

## 2017-10-18 DIAGNOSIS — Z0000 Encounter for general adult medical examination without abnormal findings: Secondary | ICD-10-CM

## 2017-10-19 MED ORDER — LEVOTHYROXINE SODIUM 75 MCG PO TABS: 75.0000 ug | ORAL_TABLET | Freq: Every day | ORAL | 0 refills | Status: DC

## 2017-10-28 ENCOUNTER — Encounter: Payer: Self-pay | Admitting: Internal Medicine

## 2017-10-28 ENCOUNTER — Ambulatory Visit (INDEPENDENT_AMBULATORY_CARE_PROVIDER_SITE_OTHER): Payer: 59 | Admitting: Internal Medicine

## 2017-10-28 VITALS — BP 120/90 | HR 86 | Temp 97.8°F | Ht 64.0 in | Wt 161.0 lb

## 2017-10-28 DIAGNOSIS — Z0001 Encounter for general adult medical examination with abnormal findings: Secondary | ICD-10-CM

## 2017-10-28 DIAGNOSIS — Z0000 Encounter for general adult medical examination without abnormal findings: Secondary | ICD-10-CM

## 2017-10-28 DIAGNOSIS — R21 Rash and other nonspecific skin eruption: Secondary | ICD-10-CM

## 2017-10-28 DIAGNOSIS — F329 Major depressive disorder, single episode, unspecified: Secondary | ICD-10-CM | POA: Diagnosis not present

## 2017-10-28 DIAGNOSIS — E034 Atrophy of thyroid (acquired): Secondary | ICD-10-CM | POA: Diagnosis not present

## 2017-10-28 DIAGNOSIS — E785 Hyperlipidemia, unspecified: Secondary | ICD-10-CM

## 2017-10-28 DIAGNOSIS — L509 Urticaria, unspecified: Secondary | ICD-10-CM

## 2017-10-28 DIAGNOSIS — F32A Depression, unspecified: Secondary | ICD-10-CM

## 2017-10-28 MED ORDER — CLOBETASOL PROPIONATE 0.05 % EX CREA: 1.0000 "application " | TOPICAL_CREAM | Freq: Two times a day (BID) | CUTANEOUS | 1 refills | Status: DC

## 2017-10-28 MED ORDER — LEVOTHYROXINE SODIUM 75 MCG PO TABS: 75.0000 ug | ORAL_TABLET | Freq: Every day | ORAL | 3 refills | Status: DC

## 2017-10-28 NOTE — Assessment & Plan Note (Signed)
On Rx 

## 2017-10-28 NOTE — Assessment & Plan Note (Signed)
Clobetazol

## 2017-10-28 NOTE — Assessment & Plan Note (Signed)
Labs

## 2017-10-28 NOTE — Progress Notes (Signed)
Subjective:  Patient ID: Barbara Costa, female    DOB: Mar 31, 1961  Age: 57 y.o. MRN: [id number]  CC: No chief complaint on file.   HPI Barbara Costa presents for a well exam C/o rash on the face F/u hypothyroidism  Outpatient Medications Prior to Visit  Medication Sig Dispense Refill  . calcium carbonate (CALCIUM 600) 600 MG TABS Take 600 mg by mouth daily.    . Cholecalciferol 3000 units TABS Take 1 tablet by mouth daily.    . clonazePAM (KLONOPIN) 0.5 MG tablet Take 0.5 mg by mouth daily.    . DULoxetine (CYMBALTA) 30 MG capsule Take 90 mg by mouth daily.    Marland Kitchen levothyroxine (SYNTHROID, LEVOTHROID) 75 MCG tablet Take 1 tablet (75 mcg total) by mouth daily. Must keep appt for future refills 30 tablet 0  . Multiple Vitamins-Minerals ([employer] SPECTRAVITE ADULT 50+ PO) Take by mouth daily.    . Probiotic Product (PROBIOTIC DAILY PO) Take 1 each by mouth daily.     No facility-administered medications prior to visit.     ROS Review of Systems  Constitutional: Negative for activity change, appetite change, chills, fatigue and unexpected weight change.  HENT: Negative for congestion, mouth sores and sinus pressure.   Eyes: Negative for visual disturbance.  Respiratory: Negative for cough and chest tightness.   Gastrointestinal: Negative for abdominal pain and nausea.  Genitourinary: Negative for difficulty urinating, frequency and vaginal pain.  Musculoskeletal: Negative for back pain and gait problem.  Skin: Negative for pallor and rash.  Neurological: Negative for dizziness, tremors, weakness, numbness and headaches.  Psychiatric/Behavioral: Negative for confusion and sleep disturbance.    Objective:  BP 120/90 (BP Location: Left Arm, Patient Position: Sitting, Cuff Size: Normal)   Pulse 86   Temp 97.8 F (36.6 C) (Oral)   Ht 5\' 4"  (1.626 m)   Wt 161 lb (73 kg)   SpO2 99%   BMI 27.64 kg/m   BP Readings from Last 3 Encounters:  10/28/17 120/90  09/02/16 114/70  09/27/15  128/74    Wt Readings from Last 3 Encounters:  10/28/17 161 lb (73 kg)  09/02/16 162 lb (73.5 kg)  09/27/15 156 lb (70.8 kg)    Physical Exam  Constitutional: She appears well-developed. No distress.  HENT:  Head: Normocephalic.  Right Ear: External ear normal.  Left Ear: External ear normal.  Nose: Nose normal.  Mouth/Throat: Oropharynx is clear and moist.  Eyes: Conjunctivae are normal. Pupils are equal, round, and reactive to light. Right eye exhibits no discharge. Left eye exhibits no discharge.  Neck: Normal range of motion. Neck supple. No JVD present. No tracheal deviation present. No thyromegaly present.  Cardiovascular: Normal rate, regular rhythm and normal heart sounds.  Pulmonary/Chest: No stridor. No respiratory distress. She has no wheezes.  Abdominal: Soft. Bowel sounds are normal. She exhibits no distension and no mass. There is no tenderness. There is no rebound and no guarding.  Musculoskeletal: She exhibits no edema or tenderness.  Lymphadenopathy:    She has no cervical adenopathy.  Neurological: She displays normal reflexes. No cranial nerve deficit. She exhibits normal muscle tone. Coordination normal.  Skin: No rash noted. No erythema.  Psychiatric: She has a normal mood and affect. Her behavior is normal. Judgment and thought content normal.  R face dry skin patch  Lab Results  Component Value Date   WBC 7.2 09/13/2016   HGB 14.1 09/13/2016   HCT 41.4 09/13/2016   PLT 288.0 09/13/2016   GLUCOSE 94  09/13/2016   CHOL 229 (H) 09/13/2016   TRIG 70.0 09/13/2016   HDL 76.00 09/13/2016   LDLDIRECT 123.6 02/14/2013   LDLCALC 139 (H) 09/13/2016   ALT 12 09/13/2016   AST 16 09/13/2016   NA 137 09/13/2016   K 4.3 09/13/2016   CL 100 09/13/2016   CREATININE 0.78 09/13/2016   BUN 23 09/13/2016   CO2 29 09/13/2016   TSH 1.85 09/13/2016    Dg Chest Port 1 View  Result Date: 07/16/2013 CLINICAL DATA:  Tachycardia during colonoscopy EXAM: PORTABLE CHEST  - 1 VIEW COMPARISON:  None. FINDINGS: The heart size and mediastinal contours are within normal limits. Both lungs are clear. The visualized skeletal structures are unremarkable. IMPRESSION: No active disease. Electronically Signed   By: Christiana PellantGretchen  Green M.D.   On: 07/16/2013 14:44    Assessment & Plan:   There are no diagnoses linked to this encounter. I have discontinued Barbara Costa's Probiotic Product (PROBIOTIC DAILY PO). I am also having her maintain her DULoxetine, clonazePAM, calcium carbonate, Multiple Vitamins-Minerals ([employer] SPECTRAVITE ADULT 50+ PO), Cholecalciferol, and levothyroxine.  No orders of the defined types were placed in this encounter.    Follow-up: No Follow-up on file.  Sonda PrimesAlex Plotnikov, MD

## 2017-10-28 NOTE — Assessment & Plan Note (Addendum)
We discussed age appropriate health related issues, including available/recomended screening tests and vaccinations. We discussed a need for adhering to healthy diet and exercise. Labs/EKG were reviewed/ordered. All questions were answered. Zostavax - will get at 60 Colon 2014 GYN q 12 mo Cologuard vs colonoscopy discussed

## 2017-11-02 ENCOUNTER — Other Ambulatory Visit (INDEPENDENT_AMBULATORY_CARE_PROVIDER_SITE_OTHER): Payer: 59

## 2017-11-02 DIAGNOSIS — E785 Hyperlipidemia, unspecified: Secondary | ICD-10-CM | POA: Diagnosis not present

## 2017-11-02 DIAGNOSIS — F329 Major depressive disorder, single episode, unspecified: Secondary | ICD-10-CM

## 2017-11-02 DIAGNOSIS — F32A Depression, unspecified: Secondary | ICD-10-CM

## 2017-11-02 LAB — CBC WITH DIFFERENTIAL/PLATELET
BASOS PCT: 0.4 % (ref 0.0–3.0)
Basophils Absolute: 0 10*3/uL (ref 0.0–0.1)
EOS ABS: 0.2 10*3/uL (ref 0.0–0.7)
EOS PCT: 3.2 % (ref 0.0–5.0)
HCT: 41.3 % (ref 36.0–46.0)
HEMOGLOBIN: 14 g/dL (ref 12.0–15.0)
LYMPHS ABS: 2.2 10*3/uL (ref 0.7–4.0)
Lymphocytes Relative: 43.1 % (ref 12.0–46.0)
MCHC: 33.9 g/dL (ref 30.0–36.0)
MCV: 92.4 fl (ref 78.0–100.0)
MONO ABS: 0.4 10*3/uL (ref 0.1–1.0)
Monocytes Relative: 8.5 % (ref 3.0–12.0)
NEUTROS PCT: 44.8 % (ref 43.0–77.0)
Neutro Abs: 2.3 10*3/uL (ref 1.4–7.7)
Platelets: 232 10*3/uL (ref 150.0–400.0)
RBC: 4.47 Mil/uL (ref 3.87–5.11)
RDW: 12.7 % (ref 11.5–15.5)
WBC: 5.2 10*3/uL (ref 4.0–10.5)

## 2017-11-02 LAB — BASIC METABOLIC PANEL
BUN: 19 mg/dL (ref 6–23)
CHLORIDE: 101 meq/L (ref 96–112)
CO2: 28 mEq/L (ref 19–32)
Calcium: 9.2 mg/dL (ref 8.4–10.5)
Creatinine, Ser: 0.76 mg/dL (ref 0.40–1.20)
GFR: 83.47 mL/min (ref >60.00)
GLUCOSE: 95 mg/dL (ref 70–99)
POTASSIUM: 4.2 meq/L (ref 3.5–5.1)
SODIUM: 136 meq/L (ref 135–145)

## 2017-11-02 LAB — HEPATIC FUNCTION PANEL
ALT: 11 U/L (ref 0–35)
AST: 13 U/L (ref 0–37)
Albumin: 4.3 g/dL (ref 3.5–5.2)
Alkaline Phosphatase: 43 U/L (ref 39–117)
BILIRUBIN DIRECT: 0.1 mg/dL (ref 0.0–0.3)
BILIRUBIN TOTAL: 0.6 mg/dL (ref 0.2–1.2)
Total Protein: 6.8 g/dL (ref 6.0–8.3)

## 2017-11-02 LAB — URINALYSIS
Bilirubin Urine: NEGATIVE
HGB URINE DIPSTICK: NEGATIVE
KETONES UR: NEGATIVE
LEUKOCYTES UA: NEGATIVE
Nitrite: NEGATIVE
Specific Gravity, Urine: 1.01 (ref 1.000–1.030)
Total Protein, Urine: NEGATIVE
URINE GLUCOSE: NEGATIVE
UROBILINOGEN UA: 0.2 (ref 0.0–1.0)
pH: 8.5 — AB (ref 5.0–8.0)

## 2017-11-02 LAB — LIPID PANEL
Cholesterol: 233 mg/dL — ABNORMAL HIGH (ref 0–200)
HDL: 68.2 mg/dL (ref >39.00)
LDL CALC: 149 mg/dL — AB (ref 0–99)
NONHDL: 164.78
Total CHOL/HDL Ratio: 3
Triglycerides: 81 mg/dL (ref 0.0–149.0)
VLDL: 16.2 mg/dL (ref 0.0–40.0)

## 2017-11-02 LAB — TSH: TSH: 2.99 u[IU]/mL (ref 0.35–4.50)

## 2017-11-16 ENCOUNTER — Other Ambulatory Visit: Payer: Self-pay | Admitting: Internal Medicine

## 2017-11-16 DIAGNOSIS — Z0000 Encounter for general adult medical examination without abnormal findings: Secondary | ICD-10-CM

## 2018-07-24 ENCOUNTER — Encounter: Payer: Self-pay | Admitting: Gastroenterology

## 2018-08-09 LAB — HM DEXA SCAN: HM DEXA SCAN: -2.1

## 2018-08-09 LAB — HM MAMMOGRAPHY

## 2018-10-25 ENCOUNTER — Encounter: Payer: 59 | Admitting: Internal Medicine

## 2018-11-06 ENCOUNTER — Encounter: Payer: Self-pay | Admitting: Internal Medicine

## 2018-11-06 ENCOUNTER — Other Ambulatory Visit (INDEPENDENT_AMBULATORY_CARE_PROVIDER_SITE_OTHER): Payer: 59

## 2018-11-06 ENCOUNTER — Ambulatory Visit (INDEPENDENT_AMBULATORY_CARE_PROVIDER_SITE_OTHER): Payer: 59 | Admitting: Internal Medicine

## 2018-11-06 VITALS — BP 122/84 | HR 66 | Temp 98.4°F | Ht 64.0 in | Wt 154.0 lb

## 2018-11-06 DIAGNOSIS — Z0000 Encounter for general adult medical examination without abnormal findings: Secondary | ICD-10-CM

## 2018-11-06 DIAGNOSIS — M8580 Other specified disorders of bone density and structure, unspecified site: Secondary | ICD-10-CM | POA: Diagnosis not present

## 2018-11-06 DIAGNOSIS — E034 Atrophy of thyroid (acquired): Secondary | ICD-10-CM

## 2018-11-06 DIAGNOSIS — E785 Hyperlipidemia, unspecified: Secondary | ICD-10-CM | POA: Diagnosis not present

## 2018-11-06 DIAGNOSIS — E559 Vitamin D deficiency, unspecified: Secondary | ICD-10-CM

## 2018-11-06 DIAGNOSIS — Z23 Encounter for immunization: Secondary | ICD-10-CM

## 2018-11-06 LAB — BASIC METABOLIC PANEL
BUN: 13 mg/dL (ref 6–23)
CALCIUM: 9.6 mg/dL (ref 8.4–10.5)
CO2: 30 meq/L (ref 19–32)
CREATININE: 0.69 mg/dL (ref 0.40–1.20)
Chloride: 100 mEq/L (ref 96–112)
GFR: 87.48 mL/min (ref >60.00)
Glucose, Bld: 87 mg/dL (ref 70–99)
Potassium: 4.2 mEq/L (ref 3.5–5.1)
Sodium: 137 mEq/L (ref 135–145)

## 2018-11-06 LAB — TSH: TSH: 4.37 u[IU]/mL (ref 0.35–4.50)

## 2018-11-06 LAB — CBC WITH DIFFERENTIAL/PLATELET
Basophils Absolute: 0 10*3/uL (ref 0.0–0.1)
Basophils Relative: 0.3 % (ref 0.0–3.0)
EOS ABS: 0.1 10*3/uL (ref 0.0–0.7)
Eosinophils Relative: 0.8 % (ref 0.0–5.0)
HEMATOCRIT: 41.9 % (ref 36.0–46.0)
Hemoglobin: 13.9 g/dL (ref 12.0–15.0)
Lymphocytes Relative: 35.3 % (ref 12.0–46.0)
Lymphs Abs: 2.3 10*3/uL (ref 0.7–4.0)
MCHC: 33.3 g/dL (ref 30.0–36.0)
MCV: 93.3 fl (ref 78.0–100.0)
Monocytes Absolute: 0.5 10*3/uL (ref 0.1–1.0)
Monocytes Relative: 7 % (ref 3.0–12.0)
Neutro Abs: 3.7 10*3/uL (ref 1.4–7.7)
Neutrophils Relative %: 56.6 % (ref 43.0–77.0)
Platelets: 263 10*3/uL (ref 150.0–400.0)
RBC: 4.48 Mil/uL (ref 3.87–5.11)
RDW: 12.8 % (ref 11.5–15.5)
WBC: 6.5 10*3/uL (ref 4.0–10.5)

## 2018-11-06 LAB — HEPATIC FUNCTION PANEL
ALK PHOS: 52 U/L (ref 39–117)
ALT: 15 U/L (ref 0–35)
AST: 21 U/L (ref 0–37)
Albumin: 4.6 g/dL (ref 3.5–5.2)
BILIRUBIN DIRECT: 0.1 mg/dL (ref 0.0–0.3)
Total Bilirubin: 0.5 mg/dL (ref 0.2–1.2)
Total Protein: 6.9 g/dL (ref 6.0–8.3)

## 2018-11-06 LAB — URINALYSIS
Bilirubin Urine: NEGATIVE
Hgb urine dipstick: NEGATIVE
Ketones, ur: NEGATIVE
LEUKOCYTES UA: NEGATIVE
Nitrite: NEGATIVE
PH: 7 (ref 5.0–8.0)
Total Protein, Urine: NEGATIVE
UROBILINOGEN UA: 0.2 (ref 0.0–1.0)
Urine Glucose: NEGATIVE

## 2018-11-06 MED ORDER — LEVOTHYROXINE SODIUM 75 MCG PO TABS: 75.0000 ug | ORAL_TABLET | Freq: Every day | ORAL | 3 refills | Status: DC

## 2018-11-06 NOTE — Assessment & Plan Note (Signed)
11/ 2019 per GYN

## 2018-11-06 NOTE — Patient Instructions (Signed)

## 2018-11-06 NOTE — Assessment & Plan Note (Signed)
A cardiac CT scan for calcium scoring offered 2/20 

## 2018-11-06 NOTE — Progress Notes (Signed)
   Subjective:  Patient ID: Barbara Costa, female    DOB: 1961-03-19  Age: 58 y.o. MRN: [id number]  CC: No chief complaint on file.   HPI Barbara Costa presents for a well exam   Outpatient Medications Prior to Visit  Medication Sig Dispense Refill  . calcium carbonate (CALCIUM 600) 600 MG TABS Take 600 mg by mouth daily.    . Cholecalciferol 3000 units TABS Take 1 tablet by mouth daily.    . clonazePAM (KLONOPIN) 0.5 MG tablet Take 0.5 mg by mouth daily.    . DULoxetine (CYMBALTA) 60 MG capsule Take 60 mg by mouth daily.     Marland Kitchen levothyroxine (SYNTHROID, LEVOTHROID) 75 MCG tablet Take 1 tablet (75 mcg total) by mouth daily. Must keep appt for future refills 90 tablet 3  . Multiple Vitamins-Minerals ([employer] SPECTRAVITE ADULT 50+ PO) Take by mouth daily.    . clobetasol cream (TEMOVATE) 0.05 % Apply 1 application topically 2 (two) times daily. 30 g 1   No facility-administered medications prior to visit.     ROS: Review of Systems  Constitutional: Negative for activity change, appetite change, chills, fatigue and unexpected weight change.  HENT: Negative for congestion, mouth sores and sinus pressure.   Eyes: Negative for visual disturbance.  Respiratory: Negative for cough and chest tightness.   Gastrointestinal: Negative for abdominal pain and nausea.  Genitourinary: Negative for difficulty urinating, frequency and vaginal pain.  Musculoskeletal: Negative for back pain and gait problem.  Skin: Negative for pallor and rash.  Neurological: Negative for dizziness, tremors, weakness, numbness and headaches.  Psychiatric/Behavioral: Negative for confusion and sleep disturbance.    Objective:  BP 122/84 (BP Location: Left Arm, Patient Position: Sitting, Cuff Size: Normal)   Pulse 66   Temp 98.4 F (36.9 C) (Oral)   Ht 5\' 4"  (1.626 m)   Wt 154 lb (69.9 kg)   SpO2 97%   BMI 26.43 kg/m   BP Readings from Last 3 Encounters:  11/06/18 122/84  10/28/17 120/90  09/02/16 114/70     Wt Readings from Last 3 Encounters:  11/06/18 154 lb (69.9 kg)  10/28/17 161 lb (73 kg)  09/02/16 162 lb (73.5 kg)    Physical Exam  Lab Results  Component Value Date   WBC 5.2 11/02/2017   HGB 14.0 11/02/2017   HCT 41.3 11/02/2017   PLT 232.0 11/02/2017   GLUCOSE 95 11/02/2017   CHOL 233 (H) 11/02/2017   TRIG 81.0 11/02/2017   HDL 68.20 11/02/2017   LDLDIRECT 123.6 02/14/2013   LDLCALC 149 (H) 11/02/2017   ALT 11 11/02/2017   AST 13 11/02/2017   NA 136 11/02/2017   K 4.2 11/02/2017   CL 101 11/02/2017   CREATININE 0.76 11/02/2017   BUN 19 11/02/2017   CO2 28 11/02/2017   TSH 2.99 11/02/2017    Dg Chest Port 1 View  Result Date: 07/16/2013 CLINICAL DATA:  Tachycardia during colonoscopy EXAM: PORTABLE CHEST - 1 VIEW COMPARISON:  None. FINDINGS: The heart size and mediastinal contours are within normal limits. Both lungs are clear. The visualized skeletal structures are unremarkable. IMPRESSION: No active disease. Electronically Signed   By: Christiana Pellant M.D.   On: 07/16/2013 14:44    Assessment & Plan:   There are no diagnoses linked to this encounter.   No orders of the defined types were placed in this encounter.    Follow-up: No follow-ups on file.  Sonda Primes, MD

## 2018-11-06 NOTE — Assessment & Plan Note (Signed)
We discussed age appropriate health related issues, including available/recomended screening tests and vaccinations. We discussed a need for adhering to healthy diet and exercise. Labs were ordered to be later reviewed . All questions were answered. Zostavax - 2020 Colon 2014 - due in 2024 GYN q 12 mo DEXA 2019  A cardiac CT scan for calcium scoring offered 2/20 [facility]

## 2018-11-06 NOTE — Assessment & Plan Note (Signed)
Labs

## 2018-11-06 NOTE — Assessment & Plan Note (Signed)
Vit D 

## 2018-11-24 ENCOUNTER — Encounter: Payer: Self-pay | Admitting: Internal Medicine

## 2019-03-12 ENCOUNTER — Ambulatory Visit (INDEPENDENT_AMBULATORY_CARE_PROVIDER_SITE_OTHER): Payer: 59

## 2019-03-12 DIAGNOSIS — Z23 Encounter for immunization: Secondary | ICD-10-CM

## 2019-03-12 DIAGNOSIS — Z299 Encounter for prophylactic measures, unspecified: Secondary | ICD-10-CM

## 2019-11-01 ENCOUNTER — Other Ambulatory Visit: Payer: Self-pay | Admitting: Internal Medicine

## 2019-11-01 DIAGNOSIS — Z0000 Encounter for general adult medical examination without abnormal findings: Secondary | ICD-10-CM

## 2020-02-21 ENCOUNTER — Encounter: Payer: Self-pay | Admitting: Internal Medicine

## 2020-02-21 ENCOUNTER — Other Ambulatory Visit: Payer: Self-pay

## 2020-02-21 ENCOUNTER — Ambulatory Visit (INDEPENDENT_AMBULATORY_CARE_PROVIDER_SITE_OTHER): Payer: 59 | Admitting: Internal Medicine

## 2020-02-21 VITALS — BP 136/80 | HR 59 | Temp 98.1°F | Ht 64.0 in | Wt 142.0 lb

## 2020-02-21 DIAGNOSIS — M8580 Other specified disorders of bone density and structure, unspecified site: Secondary | ICD-10-CM | POA: Diagnosis not present

## 2020-02-21 DIAGNOSIS — E034 Atrophy of thyroid (acquired): Secondary | ICD-10-CM | POA: Diagnosis not present

## 2020-02-21 DIAGNOSIS — E559 Vitamin D deficiency, unspecified: Secondary | ICD-10-CM

## 2020-02-21 DIAGNOSIS — I480 Paroxysmal atrial fibrillation: Secondary | ICD-10-CM

## 2020-02-21 DIAGNOSIS — E669 Obesity, unspecified: Secondary | ICD-10-CM

## 2020-02-21 DIAGNOSIS — Z0000 Encounter for general adult medical examination without abnormal findings: Secondary | ICD-10-CM

## 2020-02-21 DIAGNOSIS — R202 Paresthesia of skin: Secondary | ICD-10-CM

## 2020-02-21 LAB — HEPATIC FUNCTION PANEL
ALT: 17 U/L (ref 0–35)
AST: 20 U/L (ref 0–37)
Albumin: 4.5 g/dL (ref 3.5–5.2)
Alkaline Phosphatase: 53 U/L (ref 39–117)
Bilirubin, Direct: 0.1 mg/dL (ref 0.0–0.3)
Total Bilirubin: 0.5 mg/dL (ref 0.2–1.2)
Total Protein: 6.7 g/dL (ref 6.0–8.3)

## 2020-02-21 LAB — CBC WITH DIFFERENTIAL/PLATELET
Basophils Absolute: 0 10*3/uL (ref 0.0–0.1)
Basophils Relative: 0.4 % (ref 0.0–3.0)
Eosinophils Absolute: 0.1 10*3/uL (ref 0.0–0.7)
Eosinophils Relative: 2.2 % (ref 0.0–5.0)
HCT: 39.8 % (ref 36.0–46.0)
Hemoglobin: 13.5 g/dL (ref 12.0–15.0)
Lymphocytes Relative: 39.9 % (ref 12.0–46.0)
Lymphs Abs: 1.9 10*3/uL (ref 0.7–4.0)
MCHC: 34 g/dL (ref 30.0–36.0)
MCV: 94.9 fl (ref 78.0–100.0)
Monocytes Absolute: 0.4 10*3/uL (ref 0.1–1.0)
Monocytes Relative: 8.3 % (ref 3.0–12.0)
Neutro Abs: 2.3 10*3/uL (ref 1.4–7.7)
Neutrophils Relative %: 49.2 % (ref 43.0–77.0)
Platelets: 197 10*3/uL (ref 150.0–400.0)
RBC: 4.19 Mil/uL (ref 3.87–5.11)
RDW: 12.8 % (ref 11.5–15.5)
WBC: 4.8 10*3/uL (ref 4.0–10.5)

## 2020-02-21 LAB — LIPID PANEL
Cholesterol: 189 mg/dL (ref 0–200)
HDL: 66.5 mg/dL (ref >39.00)
LDL Cholesterol: 110 mg/dL — ABNORMAL HIGH (ref 0–99)
NonHDL: 122.71
Total CHOL/HDL Ratio: 3
Triglycerides: 62 mg/dL (ref 0.0–149.0)
VLDL: 12.4 mg/dL (ref 0.0–40.0)

## 2020-02-21 LAB — BASIC METABOLIC PANEL
BUN: 22 mg/dL (ref 6–23)
CO2: 29 mEq/L (ref 19–32)
Calcium: 9.2 mg/dL (ref 8.4–10.5)
Chloride: 103 mEq/L (ref 96–112)
Creatinine, Ser: 0.76 mg/dL (ref 0.40–1.20)
GFR: 77.9 mL/min (ref >60.00)
Glucose, Bld: 84 mg/dL (ref 70–99)
Potassium: 4 mEq/L (ref 3.5–5.1)
Sodium: 137 mEq/L (ref 135–145)

## 2020-02-21 LAB — URINALYSIS
Bilirubin Urine: NEGATIVE
Hgb urine dipstick: NEGATIVE
Ketones, ur: NEGATIVE
Leukocytes,Ua: NEGATIVE
Nitrite: NEGATIVE
Specific Gravity, Urine: 1.015 (ref 1.000–1.030)
Total Protein, Urine: NEGATIVE
Urine Glucose: NEGATIVE
Urobilinogen, UA: 0.2 (ref 0.0–1.0)
pH: 7.5 (ref 5.0–8.0)

## 2020-02-21 LAB — TSH: TSH: 3.98 u[IU]/mL (ref 0.35–4.50)

## 2020-02-21 LAB — VITAMIN B12: Vitamin B-12: 612 pg/mL (ref 211–911)

## 2020-02-21 NOTE — Progress Notes (Signed)
Subjective:  Patient ID: Barbara Costa, female    DOB: [birth year]  Age: 59 y.o. MRN: [id number]  CC: No chief complaint on file.   HPI Barbara Costa presents for a well exam C/o B hand numbness at night  Outpatient Medications Prior to Visit  Medication Sig Dispense Refill  . calcium carbonate (CALCIUM 600) 600 MG TABS Take 600 mg by mouth daily.    . Cholecalciferol 3000 units TABS Take 1 tablet by mouth daily.    . clonazePAM (KLONOPIN) 0.5 MG tablet Take 0.5 mg by mouth daily.    . DULoxetine (CYMBALTA) 60 MG capsule Take 60 mg by mouth daily.     Marland Kitchen levothyroxine (SYNTHROID) 75 MCG tablet TAKE 1 TABLET DAILY. MUST KEEP APPOINTMENT FOR FUTURE REFILLS 90 tablet 3  . Multiple Vitamins-Minerals ([employer] SPECTRAVITE ADULT 50+ PO) Take by mouth daily.     No facility-administered medications prior to visit.    ROS: Review of Systems  Constitutional: Negative for activity change, appetite change, chills, fatigue and unexpected weight change.  HENT: Negative for congestion, mouth sores and sinus pressure.   Eyes: Negative for visual disturbance.  Respiratory: Negative for cough and chest tightness.   Gastrointestinal: Negative for abdominal pain and nausea.  Genitourinary: Negative for difficulty urinating, frequency and vaginal pain.  Musculoskeletal: Negative for back pain and gait problem.  Skin: Negative for pallor and rash.  Neurological: Negative for dizziness, tremors, weakness, numbness and headaches.  Psychiatric/Behavioral: Negative for confusion and sleep disturbance.    Objective:  BP 136/80 (BP Location: Left Arm, Patient Position: Sitting, Cuff Size: Normal)   Pulse (!) 59   Temp 98.1 F (36.7 C)   Ht 5\' 4"  (1.626 m)   Wt 142 lb (64.4 kg)   SpO2 99%   BMI 24.37 kg/m   BP Readings from Last 3 Encounters:  02/21/20 136/80  11/06/18 122/84  10/28/17 120/90    Wt Readings from Last 3 Encounters:  02/21/20 142 lb (64.4 kg)  11/06/18 154 lb (69.9 kg)    10/28/17 161 lb (73 kg)    Physical Exam Constitutional:      General: She is not in acute distress.    Appearance: She is well-developed.  HENT:     Head: Normocephalic.     Right Ear: External ear normal.     Left Ear: External ear normal.     Nose: Nose normal.  Eyes:     General:        Right eye: No discharge.        Left eye: No discharge.     Conjunctiva/sclera: Conjunctivae normal.     Pupils: Pupils are equal, round, and reactive to light.  Neck:     Thyroid: No thyromegaly.     Vascular: No JVD.     Trachea: No tracheal deviation.  Cardiovascular:     Rate and Rhythm: Normal rate and regular rhythm.     Heart sounds: Normal heart sounds.  Pulmonary:     Effort: No respiratory distress.     Breath sounds: No stridor. No wheezing.  Abdominal:     General: Bowel sounds are normal. There is no distension.     Palpations: Abdomen is soft. There is no mass.     Tenderness: There is no abdominal tenderness. There is no guarding or rebound.  Musculoskeletal:        General: No tenderness.     Cervical back: Normal range of motion and neck supple.  Lymphadenopathy:  Cervical: No cervical adenopathy.  Skin:    Findings: No erythema or rash.  Neurological:     Cranial Nerves: No cranial nerve deficit.     Motor: No abnormal muscle tone.     Coordination: Coordination normal.     Deep Tendon Reflexes: Reflexes normal.  Psychiatric:        Behavior: Behavior normal.        Thought Content: Thought content normal.        Judgment: Judgment normal.     Lab Results  Component Value Date   WBC 6.5 11/06/2018   HGB 13.9 11/06/2018   HCT 41.9 11/06/2018   PLT 263.0 11/06/2018   GLUCOSE 87 11/06/2018   CHOL 233 (H) 11/02/2017   TRIG 81.0 11/02/2017   HDL 68.20 11/02/2017   LDLDIRECT 123.6 02/14/2013   LDLCALC 149 (H) 11/02/2017   ALT 15 11/06/2018   AST 21 11/06/2018   NA 137 11/06/2018   K 4.2 11/06/2018   CL 100 11/06/2018   CREATININE 0.69 11/06/2018    BUN 13 11/06/2018   CO2 30 11/06/2018   TSH 4.37 11/06/2018    DG Chest Port 1 View  Result Date: 07/16/2013 CLINICAL DATA:  Tachycardia during colonoscopy EXAM: PORTABLE CHEST - 1 VIEW COMPARISON:  None. FINDINGS: The heart size and mediastinal contours are within normal limits. Both lungs are clear. The visualized skeletal structures are unremarkable. IMPRESSION: No active disease. Electronically Signed   By: Christiana Pellant M.D.   On: 07/16/2013 14:44    Assessment & Plan:     Follow-up: No follow-ups on file.  Sonda Primes, MD

## 2020-02-21 NOTE — Assessment & Plan Note (Signed)
No relapse 

## 2020-02-21 NOTE — Assessment & Plan Note (Signed)
Discussed B12 check

## 2020-02-21 NOTE — Assessment & Plan Note (Signed)
F/u w/GYN Do not attempt more wt loss Vit D, exercise

## 2020-02-21 NOTE — Patient Instructions (Signed)

## 2020-02-21 NOTE — Addendum Note (Signed)
Addended by: Waldemar Dickens B on: 02/21/2020 08:53 AM   Modules accepted: Orders

## 2020-02-21 NOTE — Assessment & Plan Note (Signed)
  We discussed age appropriate health related issues, including available/recomended screening tests and vaccinations. Labs were ordered to be later reviewed . All questions were answered. We discussed one or more of the following - seat belt use, use of sunscreen/sun exposure exercise, safe sex, fall risk reduction, second hand smoke exposure, firearm use and storage, seat belt use, a need for adhering to healthy diet and exercise. Labs were ordered . All questions were answered.    Zostavax - 2020, COVID 2021 Colon 2014 - due in 2024 GYN q 12 mo DEXA 2019 via GYN - osteopenia A cardiac CT scan for calcium scoring offered 2/20 [facility]

## 2020-02-21 NOTE — Assessment & Plan Note (Addendum)
Do not attempt more wt loss On wt watchers BMI 24

## 2020-02-21 NOTE — Assessment & Plan Note (Signed)
Vit D 

## 2020-02-21 NOTE — Assessment & Plan Note (Signed)
Levothroid °Labs °

## 2020-10-27 ENCOUNTER — Other Ambulatory Visit: Payer: Self-pay | Admitting: Internal Medicine

## 2020-10-27 DIAGNOSIS — Z0000 Encounter for general adult medical examination without abnormal findings: Secondary | ICD-10-CM

## 2021-04-24 ENCOUNTER — Other Ambulatory Visit: Payer: Self-pay | Admitting: Internal Medicine

## 2021-04-24 DIAGNOSIS — Z0000 Encounter for general adult medical examination without abnormal findings: Secondary | ICD-10-CM

## 2021-05-06 ENCOUNTER — Encounter: Payer: 59 | Admitting: Internal Medicine

## 2021-05-12 ENCOUNTER — Other Ambulatory Visit: Payer: Self-pay

## 2021-05-12 ENCOUNTER — Encounter: Payer: Self-pay | Admitting: Internal Medicine

## 2021-05-12 ENCOUNTER — Ambulatory Visit (INDEPENDENT_AMBULATORY_CARE_PROVIDER_SITE_OTHER): Payer: 59 | Admitting: Internal Medicine

## 2021-05-12 DIAGNOSIS — E559 Vitamin D deficiency, unspecified: Secondary | ICD-10-CM

## 2021-05-12 DIAGNOSIS — E785 Hyperlipidemia, unspecified: Secondary | ICD-10-CM | POA: Diagnosis not present

## 2021-05-12 DIAGNOSIS — Z0000 Encounter for general adult medical examination without abnormal findings: Secondary | ICD-10-CM | POA: Diagnosis not present

## 2021-05-12 NOTE — Progress Notes (Signed)
Subjective:  Patient ID: Barbara Costa, female    DOB: Dec 25, 1960  Age: 60 y.o. MRN: [id number]  CC: Annual Exam   HPI [name] presents for a well exam  Outpatient Medications Prior to Visit  Medication Sig Dispense Refill   calcium carbonate (OS-CAL) 600 MG TABS tablet Take 600 mg by mouth daily.     Cholecalciferol 3000 units TABS Take 1 tablet by mouth daily.     clonazePAM (KLONOPIN) 0.5 MG tablet Take 0.5 mg by mouth daily.     DULoxetine (CYMBALTA) 60 MG capsule Take 60 mg by mouth daily.      levothyroxine (SYNTHROID) 75 MCG tablet TAKE 1 TABLET DAILY BEFORE BREAKFAST 90 tablet 3   Multiple Vitamins-Minerals ([employer]S SPECTRAVITE ADULT 50+ PO) Take by mouth daily.     No facility-administered medications prior to visit.    ROS: Review of Systems  Constitutional:  Negative for activity change, appetite change, chills, fatigue and unexpected weight change.  HENT:  Negative for congestion, mouth sores and sinus pressure.   Eyes:  Negative for visual disturbance.  Respiratory:  Negative for cough and chest tightness.   Gastrointestinal:  Negative for abdominal pain and nausea.  Genitourinary:  Negative for difficulty urinating, frequency and vaginal pain.  Musculoskeletal:  Negative for back pain and gait problem.  Skin:  Negative for pallor and rash.  Neurological:  Negative for dizziness, tremors, weakness, numbness and headaches.  Psychiatric/Behavioral:  Negative for confusion and sleep disturbance.    Objective:  BP 130/82 (BP Location: Left Arm)   Pulse (!) 59   Temp 98.2 F (36.8 C) (Oral)   Ht 5\' 4"  (1.626 m)   Wt 150 lb 3.2 oz (68.1 kg)   SpO2 96%   BMI 25.78 kg/m   BP Readings from Last 3 Encounters:  05/12/21 130/82  02/21/20 136/80  11/06/18 122/84    Wt Readings from Last 3 Encounters:  05/12/21 150 lb 3.2 oz (68.1 kg)  02/21/20 142 lb (64.4 kg)  11/06/18 154 lb (69.9 kg)    Physical Exam Constitutional:      General: She is not in acute  distress.    Appearance: She is well-developed.  HENT:     Head: Normocephalic.     Right Ear: External ear normal.     Left Ear: External ear normal.     Nose: Nose normal.  Eyes:     General:        Right eye: No discharge.        Left eye: No discharge.     Conjunctiva/sclera: Conjunctivae normal.     Pupils: Pupils are equal, round, and reactive to light.  Neck:     Thyroid: No thyromegaly.     Vascular: No JVD.     Trachea: No tracheal deviation.  Cardiovascular:     Rate and Rhythm: Normal rate and regular rhythm.     Heart sounds: Normal heart sounds.  Pulmonary:     Effort: No respiratory distress.     Breath sounds: No stridor. No wheezing.  Abdominal:     General: Bowel sounds are normal. There is no distension.     Palpations: Abdomen is soft. There is no mass.     Tenderness: There is no abdominal tenderness. There is no guarding or rebound.  Musculoskeletal:        General: No tenderness.     Cervical back: Normal range of motion and neck supple. No rigidity.  Lymphadenopathy:  Cervical: No cervical adenopathy.  Skin:    Findings: No erythema or rash.  Neurological:     Cranial Nerves: No cranial nerve deficit.     Motor: No abnormal muscle tone.     Coordination: Coordination normal.     Deep Tendon Reflexes: Reflexes normal.  Psychiatric:        Behavior: Behavior normal.        Thought Content: Thought content normal.        Judgment: Judgment normal.    Lab Results  Component Value Date   WBC 4.8 02/21/2020   HGB 13.5 02/21/2020   HCT 39.8 02/21/2020   PLT 197.0 02/21/2020   GLUCOSE 84 02/21/2020   CHOL 189 02/21/2020   TRIG 62.0 02/21/2020   HDL 66.50 02/21/2020   LDLDIRECT 123.6 02/14/2013   LDLCALC 110 (H) 02/21/2020   ALT 17 02/21/2020   AST 20 02/21/2020   NA 137 02/21/2020   K 4.0 02/21/2020   CL 103 02/21/2020   CREATININE 0.76 02/21/2020   BUN 22 02/21/2020   CO2 29 02/21/2020   TSH 3.98 02/21/2020    DG Chest Port 1  View  Result Date: 07/16/2013 CLINICAL DATA:  Tachycardia during colonoscopy EXAM: PORTABLE CHEST - 1 VIEW COMPARISON:  None. FINDINGS: The heart size and mediastinal contours are within normal limits. Both lungs are clear. The visualized skeletal structures are unremarkable. IMPRESSION: No active disease. Electronically Signed   By: Christiana Pellant M.D.   On: 07/16/2013 14:44    Assessment & Plan:    Sonda Primes, MD

## 2021-05-12 NOTE — Assessment & Plan Note (Signed)
Check labs A cardiac CT scan for calcium scoring offered  [facility]

## 2021-05-12 NOTE — Assessment & Plan Note (Signed)
We discussed age appropriate health related issues, including available/recomended screening tests and vaccinations. We discussed a need for adhering to healthy diet and exercise. Labs were ordered to be later reviewed . All questions were answered. Zostavax - 2020, COVID 2021 Colon 2014 - due in 2024 GYN q 12 mo DEXA 2019 via GYN - osteopenia A cardiac CT scan for calcium scoring offered [employer]

## 2021-05-12 NOTE — Assessment & Plan Note (Signed)
On vit D 

## 2021-06-11 ENCOUNTER — Ambulatory Visit (INDEPENDENT_AMBULATORY_CARE_PROVIDER_SITE_OTHER): Payer: 59

## 2021-06-11 ENCOUNTER — Other Ambulatory Visit: Payer: Self-pay

## 2021-06-11 DIAGNOSIS — Z0000 Encounter for general adult medical examination without abnormal findings: Secondary | ICD-10-CM | POA: Diagnosis not present

## 2021-06-11 LAB — CBC WITH DIFFERENTIAL/PLATELET
Basophils Absolute: 0 10*3/uL (ref 0.0–0.1)
Basophils Relative: 0.5 % (ref 0.0–3.0)
Eosinophils Absolute: 0.1 10*3/uL (ref 0.0–0.7)
Eosinophils Relative: 2.4 % (ref 0.0–5.0)
HCT: 40.2 % (ref 36.0–46.0)
Hemoglobin: 13.3 g/dL (ref 12.0–15.0)
Lymphocytes Relative: 44.9 % (ref 12.0–46.0)
Lymphs Abs: 2 10*3/uL (ref 0.7–4.0)
MCHC: 33.1 g/dL (ref 30.0–36.0)
MCV: 95.5 fl (ref 78.0–100.0)
Monocytes Absolute: 0.4 10*3/uL (ref 0.1–1.0)
Monocytes Relative: 8.9 % (ref 3.0–12.0)
Neutro Abs: 1.9 10*3/uL (ref 1.4–7.7)
Neutrophils Relative %: 43.3 % (ref 43.0–77.0)
Platelets: 228 10*3/uL (ref 150.0–400.0)
RBC: 4.21 Mil/uL (ref 3.87–5.11)
RDW: 12.8 % (ref 11.5–15.5)
WBC: 4.4 10*3/uL (ref 4.0–10.5)

## 2021-06-11 LAB — URINALYSIS
Bilirubin Urine: NEGATIVE
Hgb urine dipstick: NEGATIVE
Ketones, ur: NEGATIVE
Leukocytes,Ua: NEGATIVE
Nitrite: NEGATIVE
Specific Gravity, Urine: 1.015 (ref 1.000–1.030)
Total Protein, Urine: NEGATIVE
Urine Glucose: NEGATIVE
Urobilinogen, UA: 0.2 (ref 0.0–1.0)
pH: 8 (ref 5.0–8.0)

## 2021-06-11 LAB — COMPREHENSIVE METABOLIC PANEL
ALT: 14 U/L (ref 0–35)
AST: 18 U/L (ref 0–37)
Albumin: 4.2 g/dL (ref 3.5–5.2)
Alkaline Phosphatase: 48 U/L (ref 39–117)
BUN: 27 mg/dL — ABNORMAL HIGH (ref 6–23)
CO2: 31 mEq/L (ref 19–32)
Calcium: 9.3 mg/dL (ref 8.4–10.5)
Chloride: 103 mEq/L (ref 96–112)
Creatinine, Ser: 0.71 mg/dL (ref 0.40–1.20)
GFR: 92.51 mL/min (ref >60.00)
Glucose, Bld: 78 mg/dL (ref 70–99)
Potassium: 4.3 mEq/L (ref 3.5–5.1)
Sodium: 140 mEq/L (ref 135–145)
Total Bilirubin: 0.6 mg/dL (ref 0.2–1.2)
Total Protein: 6.5 g/dL (ref 6.0–8.3)

## 2021-06-11 LAB — LIPID PANEL
Cholesterol: 215 mg/dL — ABNORMAL HIGH (ref 0–200)
HDL: 83.3 mg/dL (ref >39.00)
LDL Cholesterol: 116 mg/dL — ABNORMAL HIGH (ref 0–99)
NonHDL: 131.28
Total CHOL/HDL Ratio: 3
Triglycerides: 75 mg/dL (ref 0.0–149.0)
VLDL: 15 mg/dL (ref 0.0–40.0)

## 2021-06-11 LAB — TSH: TSH: 3.22 u[IU]/mL (ref 0.35–5.50)

## 2022-04-19 ENCOUNTER — Other Ambulatory Visit: Payer: Self-pay | Admitting: Internal Medicine

## 2022-04-19 DIAGNOSIS — Z0000 Encounter for general adult medical examination without abnormal findings: Secondary | ICD-10-CM

## 2022-05-26 ENCOUNTER — Encounter: Payer: Self-pay | Admitting: Internal Medicine

## 2022-05-26 ENCOUNTER — Ambulatory Visit (INDEPENDENT_AMBULATORY_CARE_PROVIDER_SITE_OTHER): Payer: 59 | Admitting: Internal Medicine

## 2022-05-26 VITALS — BP 122/76 | HR 70 | Temp 98.1°F | Ht 64.0 in | Wt 153.4 lb

## 2022-05-26 DIAGNOSIS — E785 Hyperlipidemia, unspecified: Secondary | ICD-10-CM | POA: Diagnosis not present

## 2022-05-26 DIAGNOSIS — Z23 Encounter for immunization: Secondary | ICD-10-CM

## 2022-05-26 DIAGNOSIS — E034 Atrophy of thyroid (acquired): Secondary | ICD-10-CM | POA: Diagnosis not present

## 2022-05-26 DIAGNOSIS — Z8679 Personal history of other diseases of the circulatory system: Secondary | ICD-10-CM | POA: Diagnosis not present

## 2022-05-26 DIAGNOSIS — Z0000 Encounter for general adult medical examination without abnormal findings: Secondary | ICD-10-CM | POA: Diagnosis not present

## 2022-05-26 MED ORDER — LEVOTHYROXINE SODIUM 75 MCG PO TABS: 75.0000 ug | ORAL_TABLET | Freq: Every day | ORAL | 3 refills | Status: DC

## 2022-05-26 NOTE — Assessment & Plan Note (Signed)
A cardiac CT scan for calcium scoring offered 

## 2022-05-26 NOTE — Assessment & Plan Note (Signed)
A fib triggered by colonoscopy 2014, no relapse

## 2022-05-26 NOTE — Assessment & Plan Note (Signed)
Cont on Levothroid 

## 2022-05-26 NOTE — Progress Notes (Signed)
Subjective:  Patient ID: Barbara Costa, female    DOB: Nov 08, 1960  Age: 60 y.o. MRN: [id number]  CC: Annual Exam and Medication Refill (Need Levothyroxine)   HPI [name] presents for a well exam  Outpatient Medications Prior to Visit  Medication Sig Dispense Refill   calcium carbonate (OS-CAL) 600 MG TABS tablet Take 600 mg by mouth daily.     Cholecalciferol 3000 units TABS Take 1 tablet by mouth daily.     clonazePAM (KLONOPIN) 0.5 MG tablet Take 0.5 mg by mouth daily.     DULoxetine (CYMBALTA) 60 MG capsule Take 60 mg by mouth daily.      Multiple Vitamins-Minerals ([employer]S SPECTRAVITE ADULT 50+ PO) Take by mouth daily.     levothyroxine (SYNTHROID) 75 MCG tablet Take 1 tablet (75 mcg total) by mouth daily before breakfast. Annual appt w/labs due Aug must see provider for future refills 90 tablet 0   No facility-administered medications prior to visit.    ROS: Review of Systems  Constitutional:  Negative for activity change, appetite change, chills, fatigue and unexpected weight change.  HENT:  Negative for congestion, mouth sores and sinus pressure.   Eyes:  Negative for visual disturbance.  Respiratory:  Negative for cough and chest tightness.   Gastrointestinal:  Negative for abdominal pain and nausea.  Genitourinary:  Negative for difficulty urinating, frequency and vaginal pain.  Musculoskeletal:  Negative for back pain and gait problem.  Skin:  Negative for pallor and rash.  Neurological:  Negative for dizziness, tremors, weakness, numbness and headaches.  Psychiatric/Behavioral:  Negative for confusion and sleep disturbance.     Objective:  BP 122/76 (BP Location: Left Arm)   Pulse 70   Temp 98.1 F (36.7 C) (Oral)   Ht 5\' 4"  (1.626 m)   Wt 153 lb 6.4 oz (69.6 kg)   SpO2 99%   BMI 26.33 kg/m   BP Readings from Last 3 Encounters:  05/26/22 122/76  05/12/21 130/82  02/21/20 136/80    Wt Readings from Last 3 Encounters:  05/26/22 153 lb 6.4 oz (69.6  kg)  05/12/21 150 lb 3.2 oz (68.1 kg)  02/21/20 142 lb (64.4 kg)    Physical Exam Constitutional:      General: She is not in acute distress.    Appearance: She is well-developed.  HENT:     Head: Normocephalic.     Right Ear: External ear normal.     Left Ear: External ear normal.     Nose: Nose normal.  Eyes:     General:        Right eye: No discharge.        Left eye: No discharge.     Conjunctiva/sclera: Conjunctivae normal.     Pupils: Pupils are equal, round, and reactive to light.  Neck:     Thyroid: No thyromegaly.     Vascular: No JVD.     Trachea: No tracheal deviation.  Cardiovascular:     Rate and Rhythm: Normal rate and regular rhythm.     Heart sounds: Normal heart sounds.  Pulmonary:     Effort: No respiratory distress.     Breath sounds: No stridor. No wheezing.  Abdominal:     General: Bowel sounds are normal. There is no distension.     Palpations: Abdomen is soft. There is no mass.     Tenderness: There is no abdominal tenderness. There is no guarding or rebound.  Musculoskeletal:        General:  No tenderness.     Cervical back: Normal range of motion and neck supple. No rigidity.  Lymphadenopathy:     Cervical: No cervical adenopathy.  Skin:    Findings: No erythema or rash.  Neurological:     Mental Status: She is oriented to person, place, and time.     Cranial Nerves: No cranial nerve deficit.     Motor: No abnormal muscle tone.     Coordination: Coordination normal.     Deep Tendon Reflexes: Reflexes normal.  Psychiatric:        Behavior: Behavior normal.        Thought Content: Thought content normal.        Judgment: Judgment normal.     Lab Results  Component Value Date   WBC 4.4 06/11/2021   HGB 13.3 06/11/2021   HCT 40.2 06/11/2021   PLT 228.0 06/11/2021   GLUCOSE 78 06/11/2021   CHOL 215 (H) 06/11/2021   TRIG 75.0 06/11/2021   HDL 83.30 06/11/2021   LDLDIRECT 123.6 02/14/2013   LDLCALC 116 (H) 06/11/2021   ALT 14  06/11/2021   AST 18 06/11/2021   NA 140 06/11/2021   K 4.3 06/11/2021   CL 103 06/11/2021   CREATININE 0.71 06/11/2021   BUN 27 (H) 06/11/2021   CO2 31 06/11/2021   TSH 3.22 06/11/2021    DG Chest Port 1 View  Result Date: 07/16/2013 CLINICAL DATA:  Tachycardia during colonoscopy EXAM: PORTABLE CHEST - 1 VIEW COMPARISON:  None. FINDINGS: The heart size and mediastinal contours are within normal limits. Both lungs are clear. The visualized skeletal structures are unremarkable. IMPRESSION: No active disease. Electronically Signed   By: Christiana Pellant M.D.   On: 07/16/2013 14:44    Assessment & Plan:   Problem List Items Addressed This Visit     Dyslipidemia    A cardiac CT scan for calcium scoring offered      History of atrial fibrillation    A fib triggered by colonoscopy 2014, no relapse      Hypothyroidism    Cont on Levothroid      Relevant Medications   levothyroxine (SYNTHROID) 75 MCG tablet   Well adult exam - Primary    We discussed age appropriate health related issues, including available/recomended screening tests and vaccinations. We discussed a need for adhering to healthy diet and exercise. Labs were ordered to be later reviewed . All questions were answered. Zostavax - 2020, COVID 2021 Colon 2014 (normal, but had A fib) - due in 2024: will do Cologuard test GYN q 12 mo DEXA 2019 via GYN - osteopenia A cardiac CT scan for calcium scoring offered 2/20, 8/22 On [facility]      Relevant Medications   levothyroxine (SYNTHROID) 75 MCG tablet      Meds ordered this encounter  Medications   levothyroxine (SYNTHROID) 75 MCG tablet    Sig: Take 1 tablet (75 mcg total) by mouth daily before breakfast.    Dispense:  90 tablet    Refill:  3      Follow-up: Return for a follow-up visit.  Sonda Primes, MD

## 2022-05-26 NOTE — Assessment & Plan Note (Addendum)
We discussed age appropriate health related issues, including available/recomended screening tests and vaccinations. We discussed a need for adhering to healthy diet and exercise. Labs were ordered to be later reviewed . All questions were answered. Zostavax - 2020, COVID 2021 Colon 2014 (normal, but had A fib) - due in 2024: will do Cologuard test GYN q 12 mo DEXA 2019 via GYN - osteopenia A cardiac CT scan for calcium scoring offered 2/20, 8/22 On [facility]

## 2022-06-02 ENCOUNTER — Other Ambulatory Visit (INDEPENDENT_AMBULATORY_CARE_PROVIDER_SITE_OTHER): Payer: 59

## 2022-06-02 DIAGNOSIS — Z0000 Encounter for general adult medical examination without abnormal findings: Secondary | ICD-10-CM | POA: Diagnosis not present

## 2022-06-02 LAB — CBC WITH DIFFERENTIAL/PLATELET
Basophils Absolute: 0 10*3/uL (ref 0.0–0.1)
Basophils Relative: 0.4 % (ref 0.0–3.0)
Eosinophils Absolute: 0.1 10*3/uL (ref 0.0–0.7)
Eosinophils Relative: 1.7 % (ref 0.0–5.0)
HCT: 38.4 % (ref 36.0–46.0)
Hemoglobin: 13.1 g/dL (ref 12.0–15.0)
Lymphocytes Relative: 36.2 % (ref 12.0–46.0)
Lymphs Abs: 1.6 10*3/uL (ref 0.7–4.0)
MCHC: 34.2 g/dL (ref 30.0–36.0)
MCV: 94.3 fl (ref 78.0–100.0)
Monocytes Absolute: 0.5 10*3/uL (ref 0.1–1.0)
Monocytes Relative: 10.6 % (ref 3.0–12.0)
Neutro Abs: 2.3 10*3/uL (ref 1.4–7.7)
Neutrophils Relative %: 51.1 % (ref 43.0–77.0)
Platelets: 238 10*3/uL (ref 150.0–400.0)
RBC: 4.07 Mil/uL (ref 3.87–5.11)
RDW: 13 % (ref 11.5–15.5)
WBC: 4.5 10*3/uL (ref 4.0–10.5)

## 2022-06-02 LAB — URINALYSIS
Bilirubin Urine: NEGATIVE
Hgb urine dipstick: NEGATIVE
Ketones, ur: NEGATIVE
Leukocytes,Ua: NEGATIVE
Nitrite: NEGATIVE
Specific Gravity, Urine: 1.015 (ref 1.000–1.030)
Total Protein, Urine: NEGATIVE
Urine Glucose: NEGATIVE
Urobilinogen, UA: 0.2 (ref 0.0–1.0)
pH: 7 (ref 5.0–8.0)

## 2022-06-02 LAB — COMPREHENSIVE METABOLIC PANEL
ALT: 16 U/L (ref 0–35)
AST: 25 U/L (ref 0–37)
Albumin: 4.1 g/dL (ref 3.5–5.2)
Alkaline Phosphatase: 51 U/L (ref 39–117)
BUN: 22 mg/dL (ref 6–23)
CO2: 28 mEq/L (ref 19–32)
Calcium: 9.2 mg/dL (ref 8.4–10.5)
Chloride: 102 mEq/L (ref 96–112)
Creatinine, Ser: 0.74 mg/dL (ref 0.40–1.20)
GFR: 87.43 mL/min (ref >60.00)
Glucose, Bld: 80 mg/dL (ref 70–99)
Potassium: 3.8 mEq/L (ref 3.5–5.1)
Sodium: 137 mEq/L (ref 135–145)
Total Bilirubin: 0.6 mg/dL (ref 0.2–1.2)
Total Protein: 6.7 g/dL (ref 6.0–8.3)

## 2022-06-02 LAB — LIPID PANEL
Cholesterol: 204 mg/dL — ABNORMAL HIGH (ref 0–200)
HDL: 75.5 mg/dL (ref >39.00)
LDL Cholesterol: 116 mg/dL — ABNORMAL HIGH (ref 0–99)
NonHDL: 128.77
Total CHOL/HDL Ratio: 3
Triglycerides: 62 mg/dL (ref 0.0–149.0)
VLDL: 12.4 mg/dL (ref 0.0–40.0)

## 2022-06-02 LAB — TSH: TSH: 2.21 u[IU]/mL (ref 0.35–5.50)

## 2022-10-07 ENCOUNTER — Telehealth: Payer: 59 | Admitting: Physician Assistant

## 2022-10-07 DIAGNOSIS — H9220 Otorrhagia, unspecified ear: Secondary | ICD-10-CM

## 2022-10-07 NOTE — Progress Notes (Signed)
Because of pinkish drainage from ear (concern for blood and perforation) along with ongoing symptoms, I feel your condition warrants further evaluation and I recommend that you be seen in a face to face visit.   NOTE: There will be NO CHARGE for this eVisit   If you are having a true medical emergency please call 911.      For an urgent face to face visit, [facility] has seven urgent care centers for your convenience:     [facility] [place] [facility] [place] Get Driving Directions [phone] [place], [place] [address]    [facility] [place]) Get Driving Directions [phone] [place], [place] [address]  [facility] [place] ([place]) Get Driving Directions [phone] [address] [place],  [place]  [address]  [facility] - at [address] [place] Get Driving Directions  [phone] [address] [place],  [place] [address]   [facility] Get Driving Directions [phone] [place], [place], [place] [address]   [facility] Get Driving Directions  [phone] [address] [place], [place] [address]   [facility] [place] Get Driving Directions [phone] [address], [place], [place] [address]  Your MyChart E-visit questionnaire answers were reviewed by a board certified advanced clinical practitioner to complete your personal care plan based on your specific symptoms.  Thank you for using e-Visits.

## 2022-10-08 ENCOUNTER — Ambulatory Visit
Admission: RE | Admit: 2022-10-08 | Discharge: 2022-10-08 | Disposition: A | Discharge status: Home / Self Care | Payer: Managed Care, Other (non HMO) | Source: Ambulatory Visit | Attending: Family Medicine | Admitting: Family Medicine

## 2022-10-08 VITALS — BP 147/87 | HR 76 | Temp 98.6°F | Resp 18 | Ht 64.0 in | Wt 152.0 lb

## 2022-10-08 DIAGNOSIS — H6691 Otitis media, unspecified, right ear: Secondary | ICD-10-CM | POA: Diagnosis not present

## 2022-10-08 DIAGNOSIS — J0100 Acute maxillary sinusitis, unspecified: Secondary | ICD-10-CM

## 2022-10-08 MED ORDER — AMOXICILLIN-POT CLAVULANATE 875-125 MG PO TABS: 1.0000 | ORAL_TABLET | Freq: Two times a day (BID) | ORAL | 0 refills | Status: AC

## 2022-10-08 NOTE — ED Triage Notes (Signed)
Patient c/o right ear pain and pinkish drainage from ear x 1 week.  Patient is also having some chest congestion.  Only taken Ibuprofen.

## 2022-10-08 NOTE — Discharge Instructions (Addendum)
Instructed patient to take medication as directed with food to completion.  Encouraged patient to increase daily water intake to 64 ounces per day while taking this medication.  Advised patient not to submerge head underwater for the next 10 days.  Advised if symptoms worsen and/or unresolved please follow-up with PCP or ENT for further evaluation.

## 2022-10-08 NOTE — ED Provider Notes (Signed)
[facility]    CSN: [id number] Arrival date & time: 10/08/22  6433      History   Chief Complaint Chief Complaint  Patient presents with   Ear Drainage    Been sick for 10 days - cold/flu like systems - on 10/05/22 - ear popped from 1 am until about 7 am and drainage in RIGHT EAR - pink,   congestion, no fever registered, 2 Negative Covid tests at homeBeen taking ibuprofen - Entered by patient    HPI Barbara Costa is a 62 y.o. female.   [name] presents with cold/flulike symptoms for 10 days.  Reports on 10/05/22 ear popped from drainage and right ear with pink fluid.  PMH significant for A-fib, obesity, and fatigue.  Past Medical History:  Diagnosis Date   Anxiety    Depression    Hyperlipidemia    no meds per pt   Thyroid disease     Patient Active Problem List   Diagnosis Date Noted   History of atrial fibrillation 05/26/2022   Paresthesia 02/21/2020   Osteopenia 11/06/2018   Rash 10/28/2017   Sebaceous cyst 06/19/2015   Flank pain 01/01/2015   Atrial fibrillation ([place]) 07/16/2013   Well adult exam 02/07/2012   Obesity 02/07/2012   Vitamin D deficiency 04/24/2010   URTICARIA 01/20/2010   FATIGUE 01/20/2010   Dyslipidemia 01/30/2009   Hypothyroidism 02/08/2008   Depression 02/08/2008    Past Surgical History:  Procedure Laterality Date   BRONCHOSCOPY     had pnueumonia   COLONOSCOPY     DILATION AND CURETTAGE OF UTERUS     LAPAROSCOPIC SALPINGOOPHERECTOMY     with truclear resection of endometrial polypoid structure   MYOMECTOMY     TONSILLECTOMY      OB History   No obstetric history on file.      Home Medications    Prior to Admission medications   Medication Sig Start Date End Date Taking? Authorizing Provider  amoxicillin-clavulanate (AUGMENTIN) 875-125 MG tablet Take 1 tablet by mouth 2 (two) times daily for 10 days. 10/08/22 10/18/22 Yes Barbara Lofts, FNP  calcium carbonate (OS-CAL) 600 MG TABS tablet Take 600 mg  by mouth daily.   Yes [provider]  Cholecalciferol 3000 units TABS Take 1 tablet by mouth daily.   Yes [provider]  clonazePAM (KLONOPIN) 0.5 MG tablet Take 0.5 mg by mouth daily.   Yes [provider]  DULoxetine (CYMBALTA) 60 MG capsule Take 60 mg by mouth daily.    Yes [provider]  levothyroxine (SYNTHROID) 75 MCG tablet Take 1 tablet (75 mcg total) by mouth daily before breakfast. 05/26/22  Yes Plotnikov, Evie Lacks, MD  Multiple Vitamins-Minerals ([employer]S SPECTRAVITE ADULT 50+ PO) Take by mouth daily.   Yes [provider]    Family History Family History  Problem Relation Age of Onset   Colon polyps Father    Lung cancer Father 67   Bladder Cancer Father    Lung cancer Brother    Colon cancer Maternal Grandfather    Heart disease Mother    Congestive Heart Failure Mother    Esophageal cancer Neg Hx    Rectal cancer Neg Hx    Stomach cancer Neg Hx     Social History Social History   Tobacco Use   Smoking status: Never   Smokeless tobacco: Never  Vaping Use   Vaping Use: Never used  Substance Use Topics   Alcohol use: Yes    Alcohol/week:  1.0 - 2.0 standard drink of alcohol    Types: 1 - 2 Standard drinks or equivalent per week   Drug use: No     Allergies   Patient has no known allergies.   Review of Systems Review of Systems  HENT:  Positive for congestion, ear discharge and ear pain.   Respiratory:  Positive for cough.   All other systems reviewed and are negative.    Physical Exam Triage Vital Signs ED Triage Vitals  Enc Vitals Group     BP 10/08/22 0943 (!) 147/87     Pulse Rate 10/08/22 0943 76     Resp 10/08/22 0943 18     Temp 10/08/22 0943 98.6 F (37 C)     Temp Source 10/08/22 0943 Oral     SpO2 10/08/22 0943 99 %     Weight 10/08/22 0944 152 lb (68.9 kg)     Height 10/08/22 0944 5\' 4"  (1.626 m)     Head Circumference --      Peak Flow --      Pain Score 10/08/22 0944 0     Pain Loc --       Pain Edu? --      Excl. in [place]? --    No data found.  Updated Vital Signs BP (!) 147/87 (BP Location: Right Arm)   Pulse 76   Temp 98.6 F (37 C) (Oral)   Resp 18   Ht 5\' 4"  (1.626 m)   Wt 152 lb (68.9 kg)   SpO2 99%   BMI 26.09 kg/m   Visual Acuity Right Eye Distance:   Left Eye Distance:   Bilateral Distance:    Right Eye Near:   Left Eye Near:    Bilateral Near:     Physical Exam Vitals and nursing note reviewed.  Constitutional:      Appearance: Normal appearance. She is obese.  HENT:     Head: Normocephalic and atraumatic.     Right Ear: External ear normal.     Left Ear: Tympanic membrane, ear canal and external ear normal.     Ears:     Comments: Right TM: Erythematous bulging with scant mucopurulent effusions noted; moderate eustachian tube dysfunction noted bilaterally    Nose: Nose normal.     Comments: Turbinates are erythematous/edematous    Mouth/Throat:     Mouth: Mucous membranes are moist.     Pharynx: Oropharynx is clear.     Comments: Mild amount of clear drainage of posterior oropharynx noted Eyes:     Extraocular Movements: Extraocular movements intact.     Conjunctiva/sclera: Conjunctivae normal.     Pupils: Pupils are equal, round, and reactive to light.  Cardiovascular:     Rate and Rhythm: Normal rate and regular rhythm.     Pulses: Normal pulses.     Heart sounds: Normal heart sounds.  Pulmonary:     Effort: Pulmonary effort is normal.     Breath sounds: Normal breath sounds. No wheezing, rhonchi or rales.  Musculoskeletal:        General: Normal range of motion.  Skin:    General: Skin is warm and dry.  Neurological:     General: No focal deficit present.     Mental Status: She is alert and oriented to person, place, and time. Mental status is at baseline.      UC Treatments / Results  Labs (all labs ordered are listed, but only abnormal results are displayed) Labs Reviewed -  No data to display  EKG   Radiology No  results found.  Procedures Procedures (including critical care time)  Medications Ordered in UC Medications - No data to display  Initial Impression / Assessment and Plan / UC Course  I have reviewed the triage vital signs and the nursing notes.  Pertinent labs & imaging results that were available during my care of the patient were reviewed by me and considered in my medical decision making (see chart for details).     MDM: 1.  Acute right otitis media-Rx Augmentin; 2.  Acute maxillary sinusitis, recurrence not specified-same as 1 Rx'd Augmentin. Instructed patient to take medication as directed with food to completion.  Encouraged patient to increase daily water intake to 64 ounces per day while taking this medication.  Advised patient not to submerge head underwater for the next 10 days.  Advised if symptoms worsen and/or unresolved please follow-up with PCP or ENT for further evaluation.  Patient discharged home, hemodynamically stable.  Final Clinical Impressions(s) / UC Diagnoses   Final diagnoses:  Acute right otitis media  Acute maxillary sinusitis, recurrence not specified     Discharge Instructions      Instructed patient to take medication as directed with food to completion.  Encouraged patient to increase daily water intake to 64 ounces per day while taking this medication.  Advised patient not to submerge head underwater for the next 10 days.  Advised if symptoms worsen and/or unresolved please follow-up with PCP or ENT for further evaluation.     ED Prescriptions     Medication Sig Dispense Auth. Provider   amoxicillin-clavulanate (AUGMENTIN) 875-125 MG tablet Take 1 tablet by mouth 2 (two) times daily for 10 days. 20 tablet Trevor Iha, FNP      PDMP not reviewed this encounter.   Trevor Iha, FNP 10/08/22 1115

## 2023-04-17 ENCOUNTER — Telehealth: Payer: Managed Care, Other (non HMO) | Admitting: Physician Assistant

## 2023-04-17 DIAGNOSIS — L255 Unspecified contact dermatitis due to plants, except food: Secondary | ICD-10-CM

## 2023-04-17 MED ORDER — PREDNISONE 10 MG PO TABS: ORAL_TABLET | ORAL | 0 refills | Status: DC

## 2023-04-17 NOTE — Progress Notes (Signed)
E-Visit for Poison Ivy  We are sorry that you are not feeing well.  Here is how we plan to help!  Based on what you have shared with me it looks like you have had an allergic reaction to the oily resin from a group of plants.  This resin is very sticky, so it easily attaches to your skin, clothing, tools equipment, and pet's fur.    This blistering rash is often called poison ivy rash although it can come from contact with the leaves, stems and roots of poison ivy, poison oak and poison sumac.  The oily resin contains urushiol (u-ROO-she-ol) that produces a skin rash on exposed skin.  The severity of the rash depends on the amount of urushiol that gets on your skin.  A section of skin with more urushiol on it may develop a rash sooner.  The rash usually develops 12-48 hours after exposure and can last two to three weeks.  Your skin must come in direct contact with the plant's oil to be affected.  Blister fluid doesn't spread the rash.  However, if you come into contact with a piece of clothing or pet fur that has urushiol on it, the rash may spread out.  You can also transfer the oil to other parts of your body with your fingers.  Often the rash looks like a straight line because of the way the plant brushes against your skin.  Since your rash is widespread or has resulted in a large number of blisters, I have prescribed an oral corticosteroid.  Please follow these recommendations:  I have sent a prednisone dose pack to your chosen pharmacy. Be sure to follow the instructions carefully and complete the entire prescription. You may use Benadryl or Caladryl topical lotions to sooth the itch and remember cool, not hot, showers and baths can help relieve the itching!  Place cool, wet compresses on the affected area for 15-30 minutes several times a day.  You may also take oral antihistamines, such as diphenhydramine (Benadryl, others), which may also help you sleep better.  Watch your skin for any purulent  (pus) drainage or red streaking from the site.  If this occurs, contact your provider.  You may require an antibiotic for a skin infection.  Make sure that the clothes you were wearing as well as any towels or sheets that may have come in contact with the oil (urushiol) are washed in detergent and hot water.       I have developed the following plan to treat your condition I am prescribing a two week course of steroids (37 tablets of 10 mg prednisone).  Days 1-4 take 4 tablets (40 mg) daily  Days 5-8 take 3 tablets (30 mg) daily, Days 9-11 take 2 tablets (20 mg) daily, Days 12-14 take 1 tablet (10 mg) daily.    What can you do to prevent this rash?  Avoid the plants.  Learn how to identify poison ivy, poison oak and poison sumac in all seasons.  When hiking or engaging in other activities that might expose you to these plants, try to stay on cleared pathways.  If camping, make sure you pitch your tent in an area free of these plants.  Keep pets from running through wooded areas so that urushiol doesn't accidentally stick to their fur, which you may touch.  Remove or kill the plants.  In your yard, you can get rid of poison ivy by applying an herbicide or pulling it out of   the ground, including the roots, while wearing heavy gloves.  Afterward remove the gloves and thoroughly wash them and your hands.  Don't burn poison ivy or related plants because the urushiol can be carried by smoke.  Wear protective clothing.  If needed, protect your skin by wearing socks, boots, pants, long sleeves and vinyl gloves.  Wash your skin right away.  Washing off the oil with soap and water within 30 minutes of exposure may reduce your chances of getting a poison ivy rash.  Even washing after an hour or so can help reduce the severity of the rash.  If you walk through some poison ivy and then later touch your shoes, you may get some urushiol on your hands, which may then transfer to your face or body by touching or  rubbing.  If the contaminated object isn't cleaned, the urushiol on it can still cause a skin reaction years later.    Be careful not to reuse towels after you have washed your skin.  Also carefully wash clothing in detergent and hot water to remove all traces of the oil.  Handle contaminated clothing carefully so you don't transfer the urushiol to yourself, furniture, rugs or appliances.  Remember that pets can carry the oil on their fur and paws.  If you think your pet may be contaminated with urushiol, put on some long rubber gloves and give your pet a bath.  Finally, be careful not to burn these plants as the smoke can contain traces of the oil.  Inhaling the smoke may result in difficulty breathing. If that occurred you should see a physician as soon as possible.  See your doctor right away if:  The reaction is severe or widespread You inhaled the smoke from burning poison ivy and are having difficulty breathing Your skin continues to swell The rash affects your eyes, mouth or genitals Blisters are oozing pus You develop a fever greater than 100 F (37.8 C) The rash doesn't get better within a few weeks.  If you scratch the poison ivy rash, bacteria under your fingernails may cause the skin to become infected.  See your doctor if pus starts oozing from the blisters.  Treatment generally includes antibiotics.  Poison ivy treatments are usually limited to self-care methods.  And the rash typically goes away on its own in two to three weeks.     If the rash is widespread or results in a large number of blisters, your doctor may prescribe an oral corticosteroid, such as prednisone.  If a bacterial infection has developed at the rash site, your doctor may give you a prescription for an oral antibiotic.  MAKE SURE YOU  Understand these instructions. Will watch your condition. Will get help right away if you are not doing well or get worse.   Thank you for choosing an e-visit.  Your  e-visit answers were reviewed by a board certified advanced clinical practitioner to complete your personal care plan. Depending upon the condition, your plan could have included both over the counter or prescription medications.  Please review your pharmacy choice. Make sure the pharmacy is open so you can pick up prescription now. If there is a problem, you may contact your provider through MyChart messaging and have the prescription routed to another pharmacy.  Your safety is important to us. If you have drug allergies check your prescription carefully.   For the next 24 hours you can use MyChart to ask questions about today's visit, request a non-urgent   call back, or ask for a work or school excuse. You will get an email in the next two days asking about your experience. I hope that your e-visit has been valuable and will speed your recovery.   I have spent 5 minutes in review of e-visit questionnaire, review and updating patient chart, medical decision making and response to patient.   Jennifer M Burnette, PA-C  

## 2023-06-20 ENCOUNTER — Other Ambulatory Visit: Payer: Self-pay | Admitting: Internal Medicine

## 2023-06-20 DIAGNOSIS — Z0000 Encounter for general adult medical examination without abnormal findings: Secondary | ICD-10-CM

## 2023-07-26 ENCOUNTER — Ambulatory Visit (INDEPENDENT_AMBULATORY_CARE_PROVIDER_SITE_OTHER): Payer: Managed Care, Other (non HMO) | Admitting: Internal Medicine

## 2023-07-26 ENCOUNTER — Encounter: Payer: Self-pay | Admitting: Internal Medicine

## 2023-07-26 VITALS — BP 118/70 | HR 70 | Temp 97.7°F | Ht 64.0 in | Wt 145.0 lb

## 2023-07-26 DIAGNOSIS — E559 Vitamin D deficiency, unspecified: Secondary | ICD-10-CM

## 2023-07-26 DIAGNOSIS — Z0000 Encounter for general adult medical examination without abnormal findings: Secondary | ICD-10-CM | POA: Diagnosis not present

## 2023-07-26 DIAGNOSIS — E034 Atrophy of thyroid (acquired): Secondary | ICD-10-CM

## 2023-07-26 DIAGNOSIS — Z1211 Encounter for screening for malignant neoplasm of colon: Secondary | ICD-10-CM

## 2023-07-26 DIAGNOSIS — M8580 Other specified disorders of bone density and structure, unspecified site: Secondary | ICD-10-CM | POA: Diagnosis not present

## 2023-07-26 LAB — COMPREHENSIVE METABOLIC PANEL
ALT: 22 U/L (ref 0–35)
AST: 37 U/L (ref 0–37)
Albumin: 4.5 g/dL (ref 3.5–5.2)
Alkaline Phosphatase: 54 U/L (ref 39–117)
BUN: 19 mg/dL (ref 6–23)
CO2: 29 meq/L (ref 19–32)
Calcium: 9.5 mg/dL (ref 8.4–10.5)
Chloride: 99 meq/L (ref 96–112)
Creatinine, Ser: 0.79 mg/dL (ref 0.40–1.20)
GFR: 80.18 mL/min (ref >60.00)
Glucose, Bld: 85 mg/dL (ref 70–99)
Potassium: 3.9 meq/L (ref 3.5–5.1)
Sodium: 136 meq/L (ref 135–145)
Total Bilirubin: 0.6 mg/dL (ref 0.2–1.2)
Total Protein: 7 g/dL (ref 6.0–8.3)

## 2023-07-26 LAB — URINALYSIS
Bilirubin Urine: NEGATIVE
Hgb urine dipstick: NEGATIVE
Ketones, ur: NEGATIVE
Leukocytes,Ua: NEGATIVE
Nitrite: NEGATIVE
Specific Gravity, Urine: 1.015 (ref 1.000–1.030)
Total Protein, Urine: NEGATIVE
Urine Glucose: NEGATIVE
Urobilinogen, UA: 0.2 (ref 0.0–1.0)
pH: 8 (ref 5.0–8.0)

## 2023-07-26 LAB — CBC WITH DIFFERENTIAL/PLATELET
Basophils Absolute: 0 10*3/uL (ref 0.0–0.1)
Basophils Relative: 0.4 % (ref 0.0–3.0)
Eosinophils Absolute: 0.1 10*3/uL (ref 0.0–0.7)
Eosinophils Relative: 2.8 % (ref 0.0–5.0)
HCT: 41.7 % (ref 36.0–46.0)
Hemoglobin: 13.7 g/dL (ref 12.0–15.0)
Lymphocytes Relative: 38.9 % (ref 12.0–46.0)
Lymphs Abs: 1.8 10*3/uL (ref 0.7–4.0)
MCHC: 32.9 g/dL (ref 30.0–36.0)
MCV: 94.9 fL (ref 78.0–100.0)
Monocytes Absolute: 0.4 10*3/uL (ref 0.1–1.0)
Monocytes Relative: 8.4 % (ref 3.0–12.0)
Neutro Abs: 2.3 10*3/uL (ref 1.4–7.7)
Neutrophils Relative %: 49.5 % (ref 43.0–77.0)
Platelets: 273 10*3/uL (ref 150.0–400.0)
RBC: 4.39 Mil/uL (ref 3.87–5.11)
RDW: 12.8 % (ref 11.5–15.5)
WBC: 4.7 10*3/uL (ref 4.0–10.5)

## 2023-07-26 LAB — LIPID PANEL
Cholesterol: 215 mg/dL — ABNORMAL HIGH (ref 0–200)
HDL: 77.4 mg/dL (ref >39.00)
LDL Cholesterol: 121 mg/dL — ABNORMAL HIGH (ref 0–99)
NonHDL: 137.79
Total CHOL/HDL Ratio: 3
Triglycerides: 85 mg/dL (ref 0.0–149.0)
VLDL: 17 mg/dL (ref 0.0–40.0)

## 2023-07-26 LAB — TSH: TSH: 2.12 u[IU]/mL (ref 0.35–5.50)

## 2023-07-26 MED ORDER — LEVOTHYROXINE SODIUM 75 MCG PO TABS: 75.0000 ug | ORAL_TABLET | Freq: Every day | ORAL | 3 refills | Status: DC

## 2023-07-26 NOTE — Progress Notes (Signed)
Subjective:  Patient ID: Barbara Costa, female    DOB: 03-Oct-1960  Age: 62 y.o. MRN: [id number]  CC: Annual Exam   HPI Barbara Costa presents for a well exam  Outpatient Medications Prior to Visit  Medication Sig Dispense Refill   calcium carbonate (OS-CAL) 600 MG TABS tablet Take 600 mg by mouth daily.     Cholecalciferol 3000 units TABS Take 1 tablet by mouth daily.     clonazePAM (KLONOPIN) 0.5 MG tablet Take 0.5 mg by mouth daily.     DULoxetine (CYMBALTA) 60 MG capsule Take 60 mg by mouth daily.      Multiple Vitamins-Minerals ([employer] SPECTRAVITE ADULT 50+ PO) Take by mouth daily.     levothyroxine (SYNTHROID) 75 MCG tablet Take 1 tablet (75 mcg total) by mouth daily before breakfast. 90 tablet 3   predniSONE (DELTASONE) 10 MG tablet Days 1-4 take 4 tablets (40 mg) daily  Days 5-8 take 3 tablets (30 mg) daily, Days 9-11 take 2 tablets (20 mg) daily, Days 12-14 take 1 tablet (10 mg) daily. 37 tablet 0   No facility-administered medications prior to visit.    ROS: Review of Systems  Constitutional:  Negative for activity change, appetite change, chills, fatigue and unexpected weight change.  HENT:  Negative for congestion, mouth sores and sinus pressure.   Eyes:  Negative for visual disturbance.  Respiratory:  Negative for cough and chest tightness.   Gastrointestinal:  Negative for abdominal pain and nausea.  Genitourinary:  Negative for difficulty urinating, frequency and vaginal pain.  Musculoskeletal:  Negative for back pain and gait problem.  Skin:  Negative for pallor and rash.  Neurological:  Negative for dizziness, tremors, weakness, numbness and headaches.  Psychiatric/Behavioral:  Negative for confusion and sleep disturbance.     Objective:  BP 118/70 (BP Location: Left Arm, Patient Position: Sitting, Cuff Size: Normal)   Pulse 70   Temp 97.7 F (36.5 C) (Oral)   Ht 5\' 4"  (1.626 m)   Wt 145 lb (65.8 kg)   SpO2 98%   BMI 24.89 kg/m   BP Readings from Last 3  Encounters:  07/26/23 118/70  10/08/22 (!) 147/87  05/26/22 122/76    Wt Readings from Last 3 Encounters:  07/26/23 145 lb (65.8 kg)  10/08/22 152 lb (68.9 kg)  05/26/22 153 lb 6.4 oz (69.6 kg)    Physical Exam Constitutional:      General: She is not in acute distress.    Appearance: She is well-developed.  HENT:     Head: Normocephalic.     Right Ear: External ear normal.     Left Ear: External ear normal.     Nose: Nose normal.  Eyes:     General:        Right eye: No discharge.        Left eye: No discharge.     Conjunctiva/sclera: Conjunctivae normal.     Pupils: Pupils are equal, round, and reactive to light.  Neck:     Thyroid: No thyromegaly.     Vascular: No JVD.     Trachea: No tracheal deviation.  Cardiovascular:     Rate and Rhythm: Normal rate and regular rhythm.     Heart sounds: Normal heart sounds.  Pulmonary:     Effort: No respiratory distress.     Breath sounds: No stridor. No wheezing.  Abdominal:     General: Bowel sounds are normal. There is no distension.     Palpations: Abdomen is soft.  There is no mass.     Tenderness: There is no abdominal tenderness. There is no guarding or rebound.  Musculoskeletal:        General: No tenderness.     Cervical back: Normal range of motion and neck supple. No rigidity.  Lymphadenopathy:     Cervical: No cervical adenopathy.  Skin:    Findings: No erythema or rash.  Neurological:     Cranial Nerves: No cranial nerve deficit.     Motor: No abnormal muscle tone.     Coordination: Coordination normal.     Deep Tendon Reflexes: Reflexes normal.  Psychiatric:        Behavior: Behavior normal.        Thought Content: Thought content normal.        Judgment: Judgment normal.     Lab Results  Component Value Date   WBC 4.5 06/02/2022   HGB 13.1 06/02/2022   HCT 38.4 06/02/2022   PLT 238.0 06/02/2022   GLUCOSE 80 06/02/2022   CHOL 204 (H) 06/02/2022   TRIG 62.0 06/02/2022   HDL 75.50 06/02/2022    LDLDIRECT 123.6 02/14/2013   LDLCALC 116 (H) 06/02/2022   ALT 16 06/02/2022   AST 25 06/02/2022   NA 137 06/02/2022   K 3.8 06/02/2022   CL 102 06/02/2022   CREATININE 0.74 06/02/2022   BUN 22 06/02/2022   CO2 28 06/02/2022   TSH 2.21 06/02/2022    No results found.  Assessment & Plan:   Problem List Items Addressed This Visit     Hypothyroidism    Cont on Levothroid      Relevant Medications   levothyroxine (SYNTHROID) 75 MCG tablet   Vitamin D deficiency    On vit D      Well adult exam - Primary    We discussed age appropriate health related issues, including available/recomended screening tests and vaccinations. We discussed a need for adhering to healthy diet and exercise. Labs were ordered to be later reviewed . All questions were answered. Zostavax - 2020, COVID 2021 Colon 2014 (normal, but had A fib) - due in 2024: will do Cologuard test - ordered GYN q 12 mo DEXA 2019 via GYN - osteopenia A cardiac CT scan for calcium scoring offered  On [facility]      Relevant Medications   levothyroxine (SYNTHROID) 75 MCG tablet   Other Relevant Orders   TSH   Urinalysis   CBC with Differential/Platelet   Lipid panel   Comprehensive metabolic panel   Cologuard   Osteopenia    Vit D, exercise      Other Visit Diagnoses     Screening for colon cancer       Relevant Orders   Cologuard         Meds ordered this encounter  Medications   levothyroxine (SYNTHROID) 75 MCG tablet    Sig: Take 1 tablet (75 mcg total) by mouth daily before breakfast.    Dispense:  90 tablet    Refill:  3      Follow-up: No follow-ups on file.  Sonda Primes, MD

## 2023-07-26 NOTE — Assessment & Plan Note (Signed)
We discussed age appropriate health related issues, including available/recomended screening tests and vaccinations. We discussed a need for adhering to healthy diet and exercise. Labs were ordered to be later reviewed . All questions were answered. Zostavax - 2020, COVID 2021 Colon 2014 (normal, but had A fib) - due in 2024: will do Cologuard test - ordered GYN q 12 mo DEXA 2019 via GYN - osteopenia A cardiac CT scan for calcium scoring offered  On [employer]

## 2023-07-26 NOTE — Assessment & Plan Note (Signed)
Cont on Levothroid 

## 2023-07-26 NOTE — Assessment & Plan Note (Signed)
On vit D 

## 2023-07-26 NOTE — Assessment & Plan Note (Signed)
Vit D, exercise

## 2023-08-15 ENCOUNTER — Encounter: Payer: Self-pay | Admitting: Gastroenterology

## 2023-08-17 LAB — COLOGUARD: COLOGUARD: NEGATIVE

## 2024-03-01 ENCOUNTER — Ambulatory Visit (INDEPENDENT_AMBULATORY_CARE_PROVIDER_SITE_OTHER)

## 2024-03-01 ENCOUNTER — Ambulatory Visit: Admitting: Internal Medicine

## 2024-03-01 ENCOUNTER — Encounter: Payer: Self-pay | Admitting: Internal Medicine

## 2024-03-01 VITALS — BP 110/70 | HR 55 | Temp 97.7°F | Ht 64.0 in | Wt 139.0 lb

## 2024-03-01 DIAGNOSIS — M542 Cervicalgia: Secondary | ICD-10-CM

## 2024-03-01 NOTE — Assessment & Plan Note (Signed)
 New R side neck pain off and on 1 month, grinding sounds when turning head. No HA, no arm pain, weakness. Advil helps... R handed X ray Heat ROM exercises Ergonomic set up Ibuprofen prn

## 2024-03-01 NOTE — Progress Notes (Signed)
 Subjective:  Patient ID: Barbara Costa, female    DOB: 14-Jul-1961  Age: 63 y.o. MRN: [id number]  CC: Neck Pain (Pt states she has been having neck pain x1 month off and on with )   HPI Barbara Costa presents for R side neck pain off and on 1 month, grinding sounds when turning head. No HA, no arm pain, weakness. Advil helps... R handed  Outpatient Medications Prior to Visit  Medication Sig Dispense Refill   calcium carbonate (OS-CAL) 600 MG TABS tablet Take 600 mg by mouth daily.     Cholecalciferol 3000 units TABS Take 1 tablet by mouth daily.     clonazePAM (KLONOPIN) 0.5 MG tablet Take 0.5 mg by mouth daily.     DULoxetine (CYMBALTA) 60 MG capsule Take 60 mg by mouth daily.      levothyroxine  (SYNTHROID ) 75 MCG tablet Take 1 tablet (75 mcg total) by mouth daily before breakfast. 90 tablet 3   Multiple Vitamins-Minerals ([employer] SPECTRAVITE ADULT 50+ PO) Take by mouth daily.     No facility-administered medications prior to visit.    ROS: Review of Systems  Constitutional:  Negative for activity change, appetite change, chills, fatigue and unexpected weight change.  HENT:  Negative for congestion, mouth sores and sinus pressure.   Eyes:  Negative for visual disturbance.  Respiratory:  Negative for cough and chest tightness.   Gastrointestinal:  Negative for abdominal pain and nausea.  Genitourinary:  Negative for difficulty urinating, frequency and vaginal pain.  Musculoskeletal:  Positive for neck pain. Negative for back pain, gait problem, myalgias and neck stiffness.  Skin:  Negative for pallor and rash.  Neurological:  Negative for dizziness, tremors, weakness, numbness and headaches.  Psychiatric/Behavioral:  Negative for confusion and sleep disturbance.     Objective:  BP 110/70   Pulse (!) 55   Temp 97.7 F (36.5 C) (Oral)   Ht 5\' 4"  (1.626 m)   Wt 139 lb (63 kg)   LMP 04/27/2013   SpO2 99%   BMI 23.86 kg/m   BP Readings from Last 3 Encounters:  03/01/24 110/70   07/26/23 118/70  10/08/22 (!) 147/87    Wt Readings from Last 3 Encounters:  03/01/24 139 lb (63 kg)  07/26/23 145 lb (65.8 kg)  10/08/22 152 lb (68.9 kg)    Physical Exam Constitutional:      General: She is not in acute distress.    Appearance: She is well-developed. She is not toxic-appearing.  HENT:     Head: Normocephalic.     Right Ear: External ear normal.     Left Ear: External ear normal.     Nose: Nose normal.  Eyes:     General:        Right eye: No discharge.        Left eye: No discharge.     Conjunctiva/sclera: Conjunctivae normal.     Pupils: Pupils are equal, round, and reactive to light.  Neck:     Thyroid : No thyromegaly.     Vascular: No JVD.     Trachea: No tracheal deviation.  Cardiovascular:     Rate and Rhythm: Normal rate and regular rhythm.     Heart sounds: Normal heart sounds.  Pulmonary:     Effort: No respiratory distress.     Breath sounds: No stridor. No wheezing.  Abdominal:     General: Bowel sounds are normal. There is no distension.     Palpations: Abdomen is soft. There is no  mass.     Tenderness: There is no abdominal tenderness. There is no guarding or rebound.  Musculoskeletal:        General: Tenderness present. No signs of injury.     Cervical back: Normal range of motion and neck supple. No rigidity.  Lymphadenopathy:     Cervical: No cervical adenopathy.  Skin:    Findings: No erythema or rash.  Neurological:     Cranial Nerves: No cranial nerve deficit.     Motor: No abnormal muscle tone.     Coordination: Coordination normal.     Deep Tendon Reflexes: Reflexes normal.  Psychiatric:        Behavior: Behavior normal.        Thought Content: Thought content normal.        Judgment: Judgment normal.   R trap is tender ROM - pain w/turning to the R  Lab Results  Component Value Date   WBC 4.7 07/26/2023   HGB 13.7 07/26/2023   HCT 41.7 07/26/2023   PLT 273.0 07/26/2023   GLUCOSE 85 07/26/2023   CHOL 215 (H)  07/26/2023   TRIG 85.0 07/26/2023   HDL 77.40 07/26/2023   LDLDIRECT 123.6 02/14/2013   LDLCALC 121 (H) 07/26/2023   ALT 22 07/26/2023   AST 37 07/26/2023   NA 136 07/26/2023   K 3.9 07/26/2023   CL 99 07/26/2023   CREATININE 0.79 07/26/2023   BUN 19 07/26/2023   CO2 29 07/26/2023   TSH 2.12 07/26/2023    No results found.  Assessment & Plan:   Problem List Items Addressed This Visit     Cervical pain - Primary   New R side neck pain off and on 1 month, grinding sounds when turning head. No HA, no arm pain, weakness. Advil helps... R handed X ray Heat ROM exercises Ergonomic set up Ibuprofen prn      Relevant Orders   DG Cervical Spine Complete      No orders of the defined types were placed in this encounter.     Follow-up: Return for a follow-up visit.  Anitra Barn, MD

## 2024-03-01 NOTE — Patient Instructions (Signed)
 Use rice heating pad on neck Contour pillow

## 2024-03-04 ENCOUNTER — Ambulatory Visit: Payer: Self-pay | Admitting: Internal Medicine

## 2024-07-05 ENCOUNTER — Other Ambulatory Visit: Payer: Self-pay | Admitting: Internal Medicine

## 2024-07-05 DIAGNOSIS — Z0000 Encounter for general adult medical examination without abnormal findings: Secondary | ICD-10-CM

## 2024-08-01 ENCOUNTER — Encounter: Payer: Self-pay | Admitting: Internal Medicine

## 2024-08-01 ENCOUNTER — Ambulatory Visit (INDEPENDENT_AMBULATORY_CARE_PROVIDER_SITE_OTHER): Admitting: Internal Medicine

## 2024-08-01 VITALS — BP 122/74 | HR 96 | Temp 97.8°F | Ht 64.0 in | Wt 132.8 lb

## 2024-08-01 DIAGNOSIS — M542 Cervicalgia: Secondary | ICD-10-CM

## 2024-08-01 DIAGNOSIS — Z0000 Encounter for general adult medical examination without abnormal findings: Secondary | ICD-10-CM

## 2024-08-01 LAB — LIPID PANEL
Cholesterol: 238 mg/dL — ABNORMAL HIGH (ref 0–200)
HDL: 85.6 mg/dL (ref >39.00)
LDL Cholesterol: 136 mg/dL — ABNORMAL HIGH (ref 0–99)
NonHDL: 152.54
Total CHOL/HDL Ratio: 3
Triglycerides: 84 mg/dL (ref 0.0–149.0)
VLDL: 16.8 mg/dL (ref 0.0–40.0)

## 2024-08-01 LAB — CBC WITH DIFFERENTIAL/PLATELET
Basophils Absolute: 0 K/uL (ref 0.0–0.1)
Basophils Relative: 0.4 % (ref 0.0–3.0)
Eosinophils Absolute: 0.1 K/uL (ref 0.0–0.7)
Eosinophils Relative: 1.3 % (ref 0.0–5.0)
HCT: 41.2 % (ref 36.0–46.0)
Hemoglobin: 13.8 g/dL (ref 12.0–15.0)
Lymphocytes Relative: 40.3 % (ref 12.0–46.0)
Lymphs Abs: 1.9 K/uL (ref 0.7–4.0)
MCHC: 33.6 g/dL (ref 30.0–36.0)
MCV: 93.7 fl (ref 78.0–100.0)
Monocytes Absolute: 0.5 K/uL (ref 0.1–1.0)
Monocytes Relative: 9.9 % (ref 3.0–12.0)
Neutro Abs: 2.3 K/uL (ref 1.4–7.7)
Neutrophils Relative %: 48.1 % (ref 43.0–77.0)
Platelets: 299 K/uL (ref 150.0–400.0)
RBC: 4.39 Mil/uL (ref 3.87–5.11)
RDW: 12.7 % (ref 11.5–15.5)
WBC: 4.8 K/uL (ref 4.0–10.5)

## 2024-08-01 LAB — COMPREHENSIVE METABOLIC PANEL WITH GFR
ALT: 25 U/L (ref 0–35)
AST: 25 U/L (ref 0–37)
Albumin: 4.7 g/dL (ref 3.5–5.2)
Alkaline Phosphatase: 52 U/L (ref 39–117)
BUN: 12 mg/dL (ref 6–23)
CO2: 31 meq/L (ref 19–32)
Calcium: 9.7 mg/dL (ref 8.4–10.5)
Chloride: 102 meq/L (ref 96–112)
Creatinine, Ser: 0.77 mg/dL (ref 0.40–1.20)
GFR: 82.1 mL/min (ref >60.00)
Glucose, Bld: 85 mg/dL (ref 70–99)
Potassium: 4.3 meq/L (ref 3.5–5.1)
Sodium: 140 meq/L (ref 135–145)
Total Bilirubin: 0.7 mg/dL (ref 0.2–1.2)
Total Protein: 7.3 g/dL (ref 6.0–8.3)

## 2024-08-01 MED ORDER — LEVOTHYROXINE SODIUM 75 MCG PO TABS: 75.0000 ug | ORAL_TABLET | Freq: Every day | ORAL | 3 refills | Status: AC

## 2024-08-01 NOTE — Patient Instructions (Addendum)
 Medicinal Korean Herbal Pills] Drue Plants Artemisia Annua Pills/??? ???? (Artemisia Annua/???)  Vit D3+K2  Cardiac CT calcium scoring test $99    Computed tomography, more commonly known as a CT or CAT scan, is a diagnostic medical imaging test. Like traditional x-rays, it produces multiple images or pictures of the inside of the body. The cross-sectional images generated during a CT scan can be reformatted in multiple planes. They can even generate three-dimensional images. These images can be viewed on a computer monitor. CT images of internal organs, bones, soft tissue and blood vessels provide greater detail than traditional x-rays, particularly of soft tissues and blood vessels. A cardiac CT scan for coronary calcium is a non-invasive way of obtaining information about the presence, location and extent of calcified plaque in the coronary arteries--the vessels that supply oxygen-containing blood to the heart muscle. Calcified plaque results when there is a build-up of fat and other substances under the inner layer of the artery. This material can calcify which signals the presence of atherosclerosis, a disease of the vessel wall, also called coronary artery disease (CAD). People with this disease have an increased risk for heart attacks. In addition, over time, progression of plaque build up (CAD) can narrow the arteries or even close off blood flow to the heart. The result may be chest pain, sometimes called angina, or a heart attack. Because calcium is a marker of CAD, the amount of calcium detected on a cardiac CT scan is a helpful prognostic tool. The findings on cardiac CT are expressed as a calcium score. Another name for this test is coronary artery calcium scoring.  What are some common uses of the procedure? The goal of cardiac CT scan for calcium scoring is to determine if CAD is present and to what extent, even if there are no symptoms. It is a screening study that may be  recommended by a physician for patients with risk factors for CAD but no clinical symptoms. The major risk factors for CAD are: high blood cholesterol levels  family history of heart attacks  diabetes  high blood pressure  cigarette smoking  overweight or obese  physical inactivity   A negative cardiac CT scan for calcium scoring shows no calcification within the coronary arteries. This suggests that CAD is absent or so minimal it cannot be seen by this technique. The chance of having a heart attack over the next two to five years is very low under these circumstances. A positive test means that CAD is present, regardless of whether or not the patient is experiencing any symptoms. The amount of calcification--expressed as the calcium score--may help to predict the likelihood of a myocardial infarction (heart attack) in the coming years and helps your medical doctor or cardiologist decide whether the patient may need to take preventive medicine or undertake other measures such as diet and exercise to lower the risk for heart attack. The extent of CAD is graded according to your calcium score:  Calcium Score  Presence of CAD (coronary artery disease)  0 No evidence of CAD   1-10 Minimal evidence of CAD  11-100 Mild evidence of CAD  101-400 Moderate evidence of CAD  Over 400 Extensive evidence of CAD   Coronary artery calcium (CAC) score is a strong predictor of incident coronary heart disease (CHD) and provides predictive information beyond traditional risk factors. CAC scoring is reasonable to use in the decision to withhold, postpone, or initiate statin therapy in intermediate-risk or selected borderline-risk asymptomatic adults (age  40-75 years and LDL-C >=70 to <190 mg/dL) who do not have diabetes or established atherosclerotic cardiovascular disease (ASCVD).* In intermediate-risk (10-year ASCVD risk >=7.5% to <20%) adults or selected borderline-risk (10-year ASCVD risk >=5% to  <7.5%) adults in whom a CAC score is measured for the purpose of making a treatment decision the following recommendations have been made:   If CAC=0, it is reasonable to withhold statin therapy and reassess in 5 to 10 years, as long as higher risk conditions are absent (diabetes mellitus, family history of premature CHD in first degree relatives (males <55 years; females <65 years), cigarette smoking, or LDL >=190 mg/dL).   If CAC is 1 to 99, it is reasonable to initiate statin therapy for patients >=38 years of age.   If CAC is >=100 or >=75th percentile, it is reasonable to initiate statin therapy at any age.   Cardiology referral should be considered for patients with CAC scores >=400 or >=75th percentile.   *2018 AHA/ACC/AACVPR/AAPA/ABC/ACPM/ADA/AGS/APhA/ASPC/NLA/PCNA Guideline on the Management of Blood Cholesterol: A Report of the American College of Cardiology/American Heart Association Task Force on Clinical Practice Guidelines. J Am Coll Cardiol. 2019;73(24):3168-3209.

## 2024-08-01 NOTE — Progress Notes (Signed)
 Subjective:  Patient ID: Barbara Costa, female    DOB: [birth year]  Age: 63 y.o. MRN: [id number]  CC: Annual Exam (Annual Exam)   HPI Barbara Costa presents for a well exam  Outpatient Medications Prior to Visit  Medication Sig Dispense Refill   calcium carbonate (OS-CAL) 600 MG TABS tablet Take 600 mg by mouth daily.     Cholecalciferol 3000 units TABS Take 1 tablet by mouth daily.     clonazePAM (KLONOPIN) 0.5 MG tablet Take 0.5 mg by mouth daily.     DULoxetine (CYMBALTA) 60 MG capsule Take 60 mg by mouth daily.      Multiple Vitamins-Minerals ([employer] SPECTRAVITE ADULT 50+ PO) Take by mouth daily.     levothyroxine  (SYNTHROID ) 75 MCG tablet TAKE 1 TABLET DAILY BEFORE BREAKFAST 90 tablet 3   No facility-administered medications prior to visit.    ROS: Review of Systems  Constitutional:  Negative for activity change, appetite change, chills, fatigue and unexpected weight change.  HENT:  Negative for congestion, mouth sores and sinus pressure.   Eyes:  Negative for visual disturbance.  Respiratory:  Negative for cough and chest tightness.   Gastrointestinal:  Negative for abdominal pain and nausea.  Genitourinary:  Negative for difficulty urinating, frequency and vaginal pain.  Musculoskeletal:  Positive for neck pain and neck stiffness. Negative for back pain and gait problem.  Skin:  Negative for pallor and rash.  Neurological:  Negative for dizziness, tremors, weakness, numbness and headaches.  Hematological:  Does not bruise/bleed easily.  Psychiatric/Behavioral:  Negative for confusion and sleep disturbance.     Objective:  BP 122/74   Pulse 96   Temp 97.8 F (36.6 C)   Ht 5' 4 (1.626 m)   Wt 132 lb 12.8 oz (60.2 kg)   LMP 04/27/2013   SpO2 99%   BMI 22.80 kg/m   BP Readings from Last 3 Encounters:  08/01/24 122/74  03/01/24 110/70  07/26/23 118/70    Wt Readings from Last 3 Encounters:  08/01/24 132 lb 12.8 oz (60.2 kg)  03/01/24 139 lb (63 kg)  07/26/23  145 lb (65.8 kg)    Physical Exam Constitutional:      General: She is not in acute distress.    Appearance: She is well-developed.  HENT:     Head: Normocephalic.     Right Ear: External ear normal.     Left Ear: External ear normal.     Nose: Nose normal.  Eyes:     General:        Right eye: No discharge.        Left eye: No discharge.     Conjunctiva/sclera: Conjunctivae normal.     Pupils: Pupils are equal, round, and reactive to light.  Neck:     Thyroid : No thyromegaly.     Vascular: No JVD.     Trachea: No tracheal deviation.  Cardiovascular:     Rate and Rhythm: Normal rate and regular rhythm.     Heart sounds: Normal heart sounds.  Pulmonary:     Effort: No respiratory distress.     Breath sounds: No stridor. No wheezing.  Abdominal:     General: Bowel sounds are normal. There is no distension.     Palpations: Abdomen is soft. There is no mass.     Tenderness: There is no abdominal tenderness. There is no guarding or rebound.  Musculoskeletal:        General: Tenderness present.     Cervical back:  Normal range of motion and neck supple. No rigidity.  Lymphadenopathy:     Cervical: No cervical adenopathy.  Skin:    Findings: No erythema or rash.  Neurological:     Cranial Nerves: No cranial nerve deficit.     Motor: No abnormal muscle tone.     Coordination: Coordination normal.     Deep Tendon Reflexes: Reflexes normal.  Psychiatric:        Behavior: Behavior normal.        Thought Content: Thought content normal.        Judgment: Judgment normal.    Neck - sensitive w/ROM, tight traps  Lab Results  Component Value Date   WBC 4.7 07/26/2023   HGB 13.7 07/26/2023   HCT 41.7 07/26/2023   PLT 273.0 07/26/2023   GLUCOSE 85 07/26/2023   CHOL 215 (H) 07/26/2023   TRIG 85.0 07/26/2023   HDL 77.40 07/26/2023   LDLDIRECT 123.6 02/14/2013   LDLCALC 121 (H) 07/26/2023   ALT 22 07/26/2023   AST 37 07/26/2023   NA 136 07/26/2023   K 3.9 07/26/2023   CL  99 07/26/2023   CREATININE 0.79 07/26/2023   BUN 19 07/26/2023   CO2 29 07/26/2023   TSH 2.12 07/26/2023    No results found.  Assessment & Plan:   Problem List Items Addressed This Visit     Cervical pain - Primary   Options discussed Start PT Medicinal Korean Herbal Pills] Drue Plants Artemisia Annua Pills/??? ???? (Artemisia Annua/???)      Relevant Orders   Ambulatory referral to Physical Therapy   Well adult exam   We discussed age appropriate health related issues, including available/recomended screening tests and vaccinations. We discussed a need for adhering to healthy diet and exercise. Labs were ordered to be later reviewed . All questions were answered. Zostavax - 2020, COVID 2021 Colon 2014 (normal, but had A fib) - due in 2024: will do Cologuard (-) 2024 GYN q 12 mo DEXA 2019 via GYN - osteopenia A cardiac CT scan for calcium scoring offered 2/20, 8/22, 07/2024 On Wt watchers  Mother - breast cancer      Relevant Orders   TSH   CBC with Differential/Platelet   Lipid panel   Comprehensive metabolic panel with GFR      Meds ordered this encounter  Medications   levothyroxine  (SYNTHROID ) 75 MCG tablet    Sig: Take 1 tablet (75 mcg total) by mouth daily before breakfast.    Dispense:  90 tablet    Refill:  3      Follow-up: Return in about 6 months (around 01/29/2025) for a follow-up visit.  Barbara Noel, MD

## 2024-08-01 NOTE — Assessment & Plan Note (Addendum)
 Options discussed Start PT Medicinal Korean Herbal Pills] Drue Plants Artemisia Annua Pills/??? ???? (Artemisia Annua/???)

## 2024-08-01 NOTE — Assessment & Plan Note (Signed)
 We discussed age appropriate health related issues, including available/recomended screening tests and vaccinations. We discussed a need for adhering to healthy diet and exercise. Labs were ordered to be later reviewed . All questions were answered. Zostavax - 2020, COVID 2021 Colon 2014 (normal, but had A fib) - due in 2024: will do Cologuard (-) 2024 GYN q 12 mo DEXA 2019 via GYN - osteopenia A cardiac CT scan for calcium scoring offered 2/20, 8/22, 07/2024 On Wt watchers  Mother - breast cancer

## 2024-08-02 LAB — TSH: TSH: 2.07 u[IU]/mL (ref 0.35–5.50)

## 2024-08-03 ENCOUNTER — Ambulatory Visit: Payer: Self-pay | Admitting: Internal Medicine
